# Patient Record
Sex: Male | Born: 1960
Health system: Southern US, Community
[De-identification: ages and names within clinical notes are randomized; demographics above are authoritative.]

## PROBLEM LIST (undated history)

## (undated) DIAGNOSIS — Z8619 Personal history of other infectious and parasitic diseases: Secondary | ICD-10-CM

## (undated) DIAGNOSIS — I10 Essential (primary) hypertension: Secondary | ICD-10-CM

## (undated) DIAGNOSIS — E785 Hyperlipidemia, unspecified: Secondary | ICD-10-CM

## (undated) HISTORY — DX: Personal history of other infectious and parasitic diseases: Z86.19

## (undated) HISTORY — DX: Essential (primary) hypertension: I10

## (undated) HISTORY — DX: Hyperlipidemia, unspecified: E78.5

## (undated) HISTORY — PX: HERNIA REPAIR: SHX51

---

## 1997-08-01 HISTORY — PX: HERNIA REPAIR: SHX51

## 2012-11-11 ENCOUNTER — Emergency Department (INDEPENDENT_AMBULATORY_CARE_PROVIDER_SITE_OTHER): Payer: Self-pay

## 2012-11-11 ENCOUNTER — Emergency Department (HOSPITAL_COMMUNITY)
Admission: EM | Admit: 2012-11-11 | Discharge: 2012-11-11 | Disposition: A | Discharge status: Home / Self Care | Payer: Self-pay | Source: Home / Self Care | Attending: Emergency Medicine | Admitting: Emergency Medicine

## 2012-11-11 ENCOUNTER — Encounter (HOSPITAL_COMMUNITY): Payer: Self-pay | Admitting: Emergency Medicine

## 2012-11-11 DIAGNOSIS — T148XXA Other injury of unspecified body region, initial encounter: Secondary | ICD-10-CM

## 2012-11-11 DIAGNOSIS — S62609A Fracture of unspecified phalanx of unspecified finger, initial encounter for closed fracture: Secondary | ICD-10-CM

## 2012-11-11 DIAGNOSIS — IMO0002 Reserved for concepts with insufficient information to code with codable children: Secondary | ICD-10-CM

## 2012-11-11 DIAGNOSIS — Z23 Encounter for immunization: Secondary | ICD-10-CM

## 2012-11-11 DIAGNOSIS — S61209A Unspecified open wound of unspecified finger without damage to nail, initial encounter: Secondary | ICD-10-CM

## 2012-11-11 MED ORDER — AMOXICILLIN-POT CLAVULANATE 500-125 MG PO TABS: 1.0000 | ORAL_TABLET | Freq: Three times a day (TID) | ORAL | Status: AC

## 2012-11-11 MED ORDER — TETANUS-DIPHTH-ACELL PERTUSSIS 5-2.5-18.5 LF-MCG/0.5 IM SUSP
INTRAMUSCULAR | Status: AC
  Filled 2012-11-11: qty 0.5

## 2012-11-11 MED ORDER — TETANUS-DIPHTH-ACELL PERTUSSIS 5-2.5-18.5 LF-MCG/0.5 IM SUSP
0.5000 mL | Freq: Once | INTRAMUSCULAR | Status: AC
  Administered 2012-11-11: 0.5 mL via INTRAMUSCULAR
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
## 2018-04-25 ENCOUNTER — Other Ambulatory Visit: Payer: Self-pay

## 2018-04-25 ENCOUNTER — Ambulatory Visit (HOSPITAL_COMMUNITY)
Admission: EM | Admit: 2018-04-25 | Discharge: 2018-04-25 | Disposition: A | Discharge status: Home / Self Care | Payer: Self-pay | Attending: Family Medicine | Admitting: Family Medicine

## 2018-04-25 ENCOUNTER — Encounter (HOSPITAL_COMMUNITY): Payer: Self-pay | Admitting: Emergency Medicine

## 2018-04-25 DIAGNOSIS — R21 Rash and other nonspecific skin eruption: Secondary | ICD-10-CM

## 2018-04-25 NOTE — Discharge Instructions (Addendum)
Keep monitoring rash for healing, possible bites or shingles Do not appear infected Continue to use itching cream if this relieves your symptoms  Your blood pressure was elevated today in clinic. Please be sure to take blood pressure medications as prescribed. Please monitor your blood pressure at home or when you go to a [facility]. Please follow up with your primary care doctor to recheck blood pressure and discuss any need for medication changes.   Please go to Emergency Room if you start to experience severe headache, vision changes, decreased urine production, chest pain, shortness of breath, speech slurring, one sided weakness.

## 2018-04-25 NOTE — ED Triage Notes (Signed)
Pt has a rash on his anterior & posterior torso and left flank that started one week ago.  Pt reports pain initially, but the rash has dried up and scabbed over and he is not having pain anymore.

## 2018-04-25 NOTE — ED Provider Notes (Signed)
[facility]    CSN: [id number] Arrival date & time: 04/25/18  2458     History   Chief Complaint Chief Complaint  Patient presents with  . Rash    HPI Arthur Jenkins is a 57 y.o. male no significant past medical history presenting today for evaluation of rash to his left side.  Patient states that this started approximately 5 to 6 days ago.  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  Started off as itching and painful, denies any symptoms prior to onset of rash.  Over the past week the rash has dried up and scabbed.  He denies any drainage.  He notes minimal pain.  He has been using hydrocortisone cream over-the-counter with relief of his symptoms.  Does note he had chickenpox when he was a child.  HPI  History reviewed. No pertinent past medical history.  There are no active problems to display for this patient.   Past Surgical History:  Procedure Laterality Date  . HERNIA REPAIR         Home Medications    Prior to Admission medications   Not on File    Family History History reviewed. No pertinent family history.  Social History Social History   Tobacco Use  . Smoking status: Never Smoker  Substance Use Topics  . Alcohol use: Yes  . Drug use: No     Allergies   Patient has no known allergies.   Review of Systems Review of Systems  Constitutional: Negative for fatigue and fever.  Eyes: Negative for redness, itching and visual disturbance.  Respiratory: Negative for shortness of breath.   Cardiovascular: Negative for chest pain and leg swelling.  Gastrointestinal: Negative for nausea and vomiting.  Musculoskeletal: Negative for arthralgias and myalgias.  Skin: Positive for color change and rash. Negative for wound.  Neurological: Negative for dizziness, syncope, weakness, light-headedness and headaches.     Physical Exam Triage Vital Signs ED  Triage Vitals  Enc Vitals Group     BP 04/25/18 0847 (!) 187/127     Pulse Rate 04/25/18 0847 62     Resp --      Temp 04/25/18 0847 98.4 F (36.9 C)     Temp Source 04/25/18 0847 Oral     SpO2 04/25/18 0847 100 %     Weight --      Height --      Head Circumference --      Peak Flow --      Pain Score 04/25/18 0846 0     Pain Loc --      Pain Edu? --      Excl. in [place]? --    No data found.  Updated Vital Signs BP (!) 174/84 (BP Location: Right Arm)   Pulse 62   Temp 98.4 F (36.9 C) (Oral)   SpO2 100%   Visual Acuity Right Eye Distance:   Left Eye Distance:   Bilateral Distance:    Right Eye Near:   Left Eye Near:    Bilateral Near:     Physical Exam  Constitutional: He is oriented to person, place, and time. He appears well-developed and well-nourished.  No acute distress  HENT:  Head: Normocephalic and atraumatic.  Nose: Nose normal.  Eyes: Conjunctivae are normal.  Neck: Neck supple.  Cardiovascular: Normal rate.  Pulmonary/Chest: Effort normal. No respiratory distress.  Breathing comfortably  at rest, CTABL, no wheezing, rales or other adventitious sounds auscultated  Abdominal: He exhibits no distension.  Musculoskeletal: Normal range of motion.  Neurological: He is alert and oriented to person, place, and time.  Skin: Skin is warm and dry.  Multiple clustered scabbed lesions to left abdomen extending to flank and back, does not cross midline anteriorly or posteriorly.  Minimal surrounding erythema, no increased warmth.  Mild tenderness to palpation overlying left ribs on side.  No lesions on upper extremities or lower extremities.  Psychiatric: He has a normal mood and affect.  Nursing note and vitals reviewed.    UC Treatments / Results  Labs (all labs ordered are listed, but only abnormal results are displayed) Labs Reviewed - No data to display  EKG None  Radiology No results found.  Procedures Procedures (including critical care  time)  Medications Ordered in UC Medications - No data to display  Initial Impression / Assessment and Plan / UC Course  I have reviewed the triage vital signs and the nursing notes.  Pertinent labs & imaging results that were available during my care of the patient were reviewed by me and considered in my medical decision making (see chart for details).    Rash concerning for possible shingles, given distribution, appears to be at the not on contagious/shedding stage as lesions are all scabbed over.  Patient with minimal discomfort.  At this point will defer antivirals given stage of healing and 6 days since start.  Discussed with patient that this could still possibly be bug bites, but it appeared to be healing well.  Do not appear infected.  Continue to itch cream, continue to monitor for continued healing.Discussed strict return precautions. Patient verbalized understanding and is agreeable with plan.  Discussed with patient about his blood pressure being elevated, rechecked and was 174/84.  Discussed with patient getting set up with [facility] for a primary care.  Monitor blood pressure.  Discussed signs and symptoms to watch out for with elevated blood pressure.Discussed strict return precautions. Patient verbalized understanding and is agreeable with plan.  Final Clinical Impressions(s) / UC Diagnoses   Final diagnoses:  Rash and nonspecific skin eruption     Discharge Instructions     Keep monitoring rash for healing, possible bites or shingles Do not appear infected Continue to use itching cream if this relieves your symptoms  Your blood pressure was elevated today in clinic. Please be sure to take blood pressure medications as prescribed. Please monitor your blood pressure at home or when you go to a [facility]. Please follow up with your primary care doctor to recheck blood pressure and discuss any need for medication changes.   Please go to Emergency  Room if you start to experience severe headache, vision changes, decreased urine production, chest pain, shortness of breath, speech slurring, one sided weakness.    ED Prescriptions    None     Controlled Substance Prescriptions [place] Controlled Substance Registry consulted? Not Applicable   Janith Lima, [place] 04/25/18 [phone]

## 2018-05-08 ENCOUNTER — Inpatient Hospital Stay: Payer: Self-pay | Admitting: Family Medicine

## 2018-05-08 ENCOUNTER — Ambulatory Visit: Payer: Self-pay | Attending: Family Medicine | Admitting: Family Medicine

## 2018-05-08 ENCOUNTER — Encounter: Payer: Self-pay | Admitting: Family Medicine

## 2018-05-08 VITALS — BP 155/101 | HR 67 | Temp 98.4°F | Resp 18 | Ht 71.5 in | Wt 198.0 lb

## 2018-05-08 DIAGNOSIS — R21 Rash and other nonspecific skin eruption: Secondary | ICD-10-CM | POA: Insufficient documentation

## 2018-05-08 DIAGNOSIS — I10 Essential (primary) hypertension: Secondary | ICD-10-CM

## 2018-05-08 DIAGNOSIS — Z79899 Other long term (current) drug therapy: Secondary | ICD-10-CM | POA: Insufficient documentation

## 2018-05-08 DIAGNOSIS — F1290 Cannabis use, unspecified, uncomplicated: Secondary | ICD-10-CM | POA: Insufficient documentation

## 2018-05-08 DIAGNOSIS — Z1211 Encounter for screening for malignant neoplasm of colon: Secondary | ICD-10-CM

## 2018-05-08 MED ORDER — LOSARTAN POTASSIUM 50 MG PO TABS: 50.0000 mg | ORAL_TABLET | Freq: Every day | ORAL | 3 refills | Status: DC

## 2018-05-08 NOTE — Patient Instructions (Signed)
DASH Eating Plan DASH stands for "Dietary Approaches to Stop Hypertension." The DASH eating plan is a healthy eating plan that has been shown to reduce high blood pressure (hypertension). It may also reduce your risk for type 2 diabetes, heart disease, and stroke. The DASH eating plan may also help with weight loss. What are tips for following this plan? General guidelines  Avoid eating more than 2,300 mg (milligrams) of salt (sodium) a day. If you have hypertension, you may need to reduce your sodium intake to 1,500 mg a day.  Limit alcohol intake to no more than 1 drink a day for nonpregnant women and 2 drinks a day for men. One drink equals 12 oz of beer, 5 oz of wine, or 1 oz of hard liquor.  Work with your health care provider to maintain a healthy body weight or to lose weight. Ask what an ideal weight is for you.  Get at least 30 minutes of exercise that causes your heart to beat faster (aerobic exercise) most days of the week. Activities may include walking, swimming, or biking.  Work with your health care provider or diet and nutrition specialist (dietitian) to adjust your eating plan to your individual calorie needs. Reading food labels  Check food labels for the amount of sodium per serving. Choose foods with less than 5 percent of the Daily Value of sodium. Generally, foods with less than 300 mg of sodium per serving fit into this eating plan.  To find whole grains, look for the word "whole" as the first word in the ingredient list. Shopping  Buy products labeled as "low-sodium" or "no salt added."  Buy fresh foods. Avoid canned foods and premade or frozen meals. Cooking  Avoid adding salt when cooking. Use salt-free seasonings or herbs instead of table salt or sea salt. Check with your health care provider or pharmacist before using salt substitutes.  Do not fry foods. Cook foods using healthy methods such as baking, boiling, grilling, and broiling instead.  Cook with  heart-healthy oils, such as olive, canola, soybean, or sunflower oil. Meal planning   Eat a balanced diet that includes: ? 5 or more servings of fruits and vegetables each day. At each meal, try to fill half of your plate with fruits and vegetables. ? Up to 6-8 servings of whole grains each day. ? Less than 6 oz of lean meat, poultry, or fish each day. A 3-oz serving of meat is about the same size as a deck of cards. One egg equals 1 oz. ? 2 servings of low-fat dairy each day. ? A serving of nuts, seeds, or beans 5 times each week. ? Heart-healthy fats. Healthy fats called Omega-3 fatty acids are found in foods such as flaxseeds and coldwater fish, like sardines, salmon, and mackerel.  Limit how much you eat of the following: ? Canned or prepackaged foods. ? Food that is high in trans fat, such as fried foods. ? Food that is high in saturated fat, such as fatty meat. ? Sweets, desserts, sugary drinks, and other foods with added sugar. ? Full-fat dairy products.  Do not salt foods before eating.  Try to eat at least 2 vegetarian meals each week.  Eat more home-cooked food and less restaurant, buffet, and fast food.  When eating at a restaurant, ask that your food be prepared with less salt or no salt, if possible. What foods are recommended? The items listed may not be a complete list. Talk with your dietitian about what   dietary choices are best for you. Grains Whole-grain or whole-wheat bread. Whole-grain or whole-wheat pasta. Brown rice. Oatmeal. Quinoa. Bulgur. Whole-grain and low-sodium cereals. Pita bread. Low-fat, low-sodium crackers. Whole-wheat flour tortillas. Vegetables Fresh or frozen vegetables (raw, steamed, roasted, or grilled). Low-sodium or reduced-sodium tomato and vegetable juice. Low-sodium or reduced-sodium tomato sauce and tomato paste. Low-sodium or reduced-sodium canned vegetables. Fruits All fresh, dried, or frozen fruit. Canned fruit in natural juice (without  added sugar). Meat and other protein foods Skinless chicken or turkey. Ground chicken or turkey. Pork with fat trimmed off. Fish and seafood. Egg whites. Dried beans, peas, or lentils. Unsalted nuts, nut butters, and seeds. Unsalted canned beans. Lean cuts of beef with fat trimmed off. Low-sodium, lean deli meat. Dairy Low-fat (1%) or fat-free (skim) milk. Fat-free, low-fat, or reduced-fat cheeses. Nonfat, low-sodium ricotta or cottage cheese. Low-fat or nonfat yogurt. Low-fat, low-sodium cheese. Fats and oils Soft margarine without trans fats. Vegetable oil. Low-fat, reduced-fat, or light mayonnaise and salad dressings (reduced-sodium). Canola, safflower, olive, soybean, and sunflower oils. Avocado. Seasoning and other foods Herbs. Spices. Seasoning mixes without salt. Unsalted popcorn and pretzels. Fat-free sweets. What foods are not recommended? The items listed may not be a complete list. Talk with your dietitian about what dietary choices are best for you. Grains Baked goods made with fat, such as croissants, muffins, or some breads. Dry pasta or rice meal packs. Vegetables Creamed or fried vegetables. Vegetables in a cheese sauce. Regular canned vegetables (not low-sodium or reduced-sodium). Regular canned tomato sauce and paste (not low-sodium or reduced-sodium). Regular tomato and vegetable juice (not low-sodium or reduced-sodium). Pickles. Olives. Fruits Canned fruit in a light or heavy syrup. Fried fruit. Fruit in cream or butter sauce. Meat and other protein foods Fatty cuts of meat. Ribs. Fried meat. Bacon. Sausage. Bologna and other processed lunch meats. Salami. Fatback. Hotdogs. Bratwurst. Salted nuts and seeds. Canned beans with added salt. Canned or smoked fish. Whole eggs or egg yolks. Chicken or turkey with skin. Dairy Whole or 2% milk, cream, and half-and-half. Whole or full-fat cream cheese. Whole-fat or sweetened yogurt. Full-fat cheese. Nondairy creamers. Whipped toppings.  Processed cheese and cheese spreads. Fats and oils Butter. Stick margarine. Lard. Shortening. Ghee. Bacon fat. Tropical oils, such as coconut, palm kernel, or palm oil. Seasoning and other foods Salted popcorn and pretzels. Onion salt, garlic salt, seasoned salt, table salt, and sea salt. Worcestershire sauce. Tartar sauce. Barbecue sauce. Teriyaki sauce. Soy sauce, including reduced-sodium. Steak sauce. Canned and packaged gravies. Fish sauce. Oyster sauce. Cocktail sauce. Horseradish that you find on the shelf. Ketchup. Mustard. Meat flavorings and tenderizers. Bouillon cubes. Hot sauce and Tabasco sauce. Premade or packaged marinades. Premade or packaged taco seasonings. Relishes. Regular salad dressings. Where to find more information:  National Heart, Lung, and Blood Institute: www.nhlbi.nih.gov  American Heart Association: www.heart.org Summary  The DASH eating plan is a healthy eating plan that has been shown to reduce high blood pressure (hypertension). It may also reduce your risk for type 2 diabetes, heart disease, and stroke.  With the DASH eating plan, you should limit salt (sodium) intake to 2,300 mg a day. If you have hypertension, you may need to reduce your sodium intake to 1,500 mg a day.  When on the DASH eating plan, aim to eat more fresh fruits and vegetables, whole grains, lean proteins, low-fat dairy, and heart-healthy fats.  Work with your health care provider or diet and nutrition specialist (dietitian) to adjust your eating plan to your individual   calorie needs. This information is not intended to replace advice given to you by your health care provider. Make sure you discuss any questions you have with your health care provider. Document Released: 07/07/2011 Document Revised: 07/11/2016 Document Reviewed: 07/11/2016 Elsevier Interactive Patient Education  2018 [employer].  Hypertension Hypertension is another name for high blood pressure. High blood pressure  forces your heart to work harder to pump blood. This can cause problems over time. There are two numbers in a blood pressure reading. There is a top number (systolic) over a bottom number (diastolic). It is best to have a blood pressure below 120/80. Healthy choices can help lower your blood pressure. You may need medicine to help lower your blood pressure if:  Your blood pressure cannot be lowered with healthy choices.  Your blood pressure is higher than 130/80.  Follow these instructions at home: Eating and drinking  If directed, follow the DASH eating plan. This diet includes: ? Filling half of your plate at each meal with fruits and vegetables. ? Filling one quarter of your plate at each meal with whole grains. Whole grains include whole wheat pasta, brown rice, and whole grain bread. ? Eating or drinking low-fat dairy products, such as skim milk or low-fat yogurt. ? Filling one quarter of your plate at each meal with low-fat (lean) proteins. Low-fat proteins include fish, skinless chicken, eggs, beans, and tofu. ? Avoiding fatty meat, cured and processed meat, or chicken with skin. ? Avoiding premade or processed food.  Eat less than 1,500 mg of salt (sodium) a day.  Limit alcohol use to no more than 1 drink a day for nonpregnant women and 2 drinks a day for men. One drink equals 12 oz of beer, 5 oz of wine, or 1 oz of hard liquor. Lifestyle  Work with your doctor to stay at a healthy weight or to lose weight. Ask your doctor what the best weight is for you.  Get at least 30 minutes of exercise that causes your heart to beat faster (aerobic exercise) most days of the week. This may include walking, swimming, or biking.  Get at least 30 minutes of exercise that strengthens your muscles (resistance exercise) at least 3 days a week. This may include lifting weights or pilates.  Do not use any products that contain nicotine or tobacco. This includes cigarettes and e-cigarettes. If you  need help quitting, ask your doctor.  Check your blood pressure at home as told by your doctor.  Keep all follow-up visits as told by your doctor. This is important. Medicines  Take over-the-counter and prescription medicines only as told by your doctor. Follow directions carefully.  Do not skip doses of blood pressure medicine. The medicine does not work as well if you skip doses. Skipping doses also puts you at risk for problems.  Ask your doctor about side effects or reactions to medicines that you should watch for. Contact a doctor if:  You think you are having a reaction to the medicine you are taking.  You have headaches that keep coming back (recurring).  You feel dizzy.  You have swelling in your ankles.  You have trouble with your vision. Get help right away if:  You get a very bad headache.  You start to feel confused.  You feel weak or numb.  You feel faint.  You get very bad pain in your: ? Chest. ? Belly (abdomen).  You throw up (vomit) more than once.  You have trouble breathing.   Summary  Hypertension is another name for high blood pressure.  Making healthy choices can help lower blood pressure. If your blood pressure cannot be controlled with healthy choices, you may need to take medicine. This information is not intended to replace advice given to you by your health care provider. Make sure you discuss any questions you have with your health care provider. Document Released: 01/04/2008 Document Revised: 06/15/2016 Document Reviewed: 06/15/2016 Elsevier Interactive Patient Education  2018 Else[employer] Inc.  

## 2018-05-08 NOTE — Progress Notes (Signed)
Subjective:    Patient ID: Arthur Jenkins, male    DOB: [birth year], 57 y.o.   MRN: [id number]  HPI 57 year old male new to the practice who was worked into the schedule as a follow-up for a recent ED visit.  Patient presented to the urgent care secondary to the complaint of a rash the patient's blood pressure was found to be elevated at 187/127 per urgent care records.  Repeat blood pressure was 174/84.  Patient's rash was diagnosed as possible shingles but since patient's rash was 16 days old at the time of presentation and was improving, no medication was prescribed by urgent care.  Patient reports at today's visit that his rash has resolved.  Patient is seen today for follow-up of elevated blood pressure, hypertension.  Patient reports that he is also been checking his blood pressures at home and they have remained elevated anywhere from 150 to the 170s for the top number and 90s to 100s for the bottom number.  Patient however has had no headaches or dizziness.  Patient denies any chest pain, no shortness of breath no peripheral edema.  Patient has never had a prior diagnosis of high blood pressure.      Patient reports family history is significant for his mother passing away at age 29 due to pancreatic cancer otherwise there has been no other family members with cancer, no hypertension, no diabetes, no heart disease and no strokes.  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  Patient does not drink alcohol and does not smoke cigarettes but states that he does smoke marijuana on the weekends.  Patient has a past surgical history of left inguinal hernia repair as a young adult.  Patient reports no other significant past medical history.   Review of Systems  Constitutional: Negative for chills, diaphoresis, fatigue and fever.  Respiratory: Negative for cough, chest tightness and shortness of breath.   Cardiovascular: Negative for chest pain, palpitations and leg swelling.    Gastrointestinal: Negative for abdominal pain, blood in stool and nausea.  Genitourinary: Negative for dysuria and frequency.  Musculoskeletal: Positive for back pain (Occasional but not currently bothering him). Negative for arthralgias, gait problem and joint swelling.  Neurological: Negative for dizziness, weakness, light-headedness, numbness and headaches.       Objective:   Physical Exam BP (!) 155/101 (BP Location: Left Arm, Patient Position: Sitting, Cuff Size: Normal)   Pulse 67   Temp 98.4 F (36.9 C) (Oral)   Resp 18   Ht 5' 11.5" (1.816 m)   Wt 198 lb (89.8 kg)   SpO2 100%   BMI 27.23 kg/m  Vital signs and nurse's notes reviewed General-well-nourished, well-developed older male in no acute distress Neck-supple, no lymphadenopathy, no thyromegaly, no carotid bruit Cardiovascular-regular rate and rhythm Lungs-clear to auscultation bilaterally Abdomen-soft, nontender Back-no CVA tenderness Extremities-no edema        Assessment & Plan:  1. Essential hypertension Notes were reviewed from patient's recent urgent care visit and patient had elevated blood pressure at that time and he reports that home blood pressures have also been elevated.  Patient additionally with blood pressure of 155/101 at today's visit.  Patient agrees to start medication to help with his blood pressure.  Prescription was sent to patient's pharmacy for losartan 50 mg once daily patient was given information on DASH diet and hypertension as part of his after visit summary.  Patient will continue home monitoring and will return to clinic in a few  weeks for recheck/follow-up of blood pressure but should call sooner if he does not feel that his blood pressures improved or if he has any other concerns.  Patient will have BMP to check electrolytes and renal function as well as lipid panel as he has hypertension.  Patient will also have urine microalbumin to look for any albuminuria suggestive of injury to the  kidney secondary to patient's hypertension. - losartan (COZAAR) 50 MG tablet; Take 1 tablet (50 mg total) by mouth daily. To lower blood pressure  Dispense: 30 tablet; Refill: 3 - Basic Metabolic Panel - Lipid panel - Microalbumin/Creatinine Ratio, Urine  2. Screening for colon cancer Patient is currently 48 and has not had a baseline colonoscopy.  Patient agrees to be referred for screening colonoscopy. - Ambulatory referral to Gastroenterology  *Patient was offered influenza immunization at today's visit which he declined  An After Visit Summary was printed and given to the patient. Allergies as of 05/08/2018   No Known Allergies     Medication List        Accurate as of 05/08/18  6:06 PM. Always use your most recent med list.          losartan 50 MG tablet Commonly known as:  COZAAR Take 1 tablet (50 mg total) by mouth daily. To lower blood pressure       Return in about 2 weeks (around 05/22/2018) for Hypertension .

## 2018-05-09 LAB — BASIC METABOLIC PANEL WITH GFR
BUN/Creatinine Ratio: 11 (ref 9–20)
BUN: 12 mg/dL (ref 6–24)
CO2: 22 mmol/L (ref 20–29)
Calcium: 10.1 mg/dL (ref 8.7–10.2)
Chloride: 101 mmol/L (ref 96–106)
Creatinine, Ser: 1.05 mg/dL (ref 0.76–1.27)
GFR calc Af Amer: 91 mL/min/1.73
GFR calc non Af Amer: 79 mL/min/1.73
Glucose: 89 mg/dL (ref 65–99)
Potassium: 4.6 mmol/L (ref 3.5–5.2)
Sodium: 139 mmol/L (ref 134–144)

## 2018-05-09 LAB — LIPID PANEL
Chol/HDL Ratio: 4 ratio (ref 0.0–5.0)
Cholesterol, Total: 241 mg/dL — ABNORMAL HIGH (ref 100–199)
HDL: 60 mg/dL
LDL Calculated: 166 mg/dL — ABNORMAL HIGH (ref 0–99)
Triglycerides: 73 mg/dL (ref 0–149)
VLDL Cholesterol Cal: 15 mg/dL (ref 5–40)

## 2018-05-09 LAB — MICROALBUMIN / CREATININE URINE RATIO
Creatinine, Urine: 108.4 mg/dL
Microalb/Creat Ratio: 11.8 mg/g{creat} (ref 0.0–30.0)
Microalbumin, Urine: 12.8 ug/mL

## 2018-05-11 ENCOUNTER — Telehealth: Payer: Self-pay

## 2018-05-11 NOTE — Telephone Encounter (Signed)
Patient was called, no answer, lvm to return call regarding lab results.

## 2018-05-11 NOTE — Telephone Encounter (Signed)
-----   Message from Antony Blackbird, MD sent at 05/11/2018  2:05 PM EDT ----- Please notify patient that his BMP is normal and normal urine creatinine/microalbumin ratio. LDL/bad cholesterol is elevated at 166. He should consider statin medication to lower his cholesterol versus a 6 month trial of a low fat diet and exercise and then start cholesterol medication if his LDL is not 100 or less after 6 months. If he would like to start cholesterol medication then I will need to know where to send the medication

## 2018-05-15 ENCOUNTER — Inpatient Hospital Stay: Payer: Self-pay | Admitting: Family Medicine

## 2018-05-30 ENCOUNTER — Telehealth: Payer: Self-pay | Admitting: Urology

## 2018-05-30 ENCOUNTER — Ambulatory Visit: Payer: Self-pay | Attending: Family Medicine | Admitting: Family Medicine

## 2018-05-30 ENCOUNTER — Encounter: Payer: Self-pay | Admitting: Family Medicine

## 2018-05-30 VITALS — BP 134/84 | HR 83 | Temp 98.4°F | Resp 16 | Ht 71.5 in | Wt 199.6 lb

## 2018-05-30 DIAGNOSIS — Z8619 Personal history of other infectious and parasitic diseases: Secondary | ICD-10-CM | POA: Insufficient documentation

## 2018-05-30 DIAGNOSIS — Z79899 Other long term (current) drug therapy: Secondary | ICD-10-CM | POA: Insufficient documentation

## 2018-05-30 DIAGNOSIS — Z23 Encounter for immunization: Secondary | ICD-10-CM | POA: Insufficient documentation

## 2018-05-30 DIAGNOSIS — E785 Hyperlipidemia, unspecified: Secondary | ICD-10-CM | POA: Insufficient documentation

## 2018-05-30 DIAGNOSIS — E7800 Pure hypercholesterolemia, unspecified: Secondary | ICD-10-CM

## 2018-05-30 DIAGNOSIS — I10 Essential (primary) hypertension: Secondary | ICD-10-CM | POA: Insufficient documentation

## 2018-05-30 DIAGNOSIS — M545 Low back pain: Secondary | ICD-10-CM | POA: Insufficient documentation

## 2018-05-30 MED ORDER — ZOSTER VAC RECOMB ADJUVANTED 50 MCG/0.5ML IM SUSR: 0.5000 mL | Freq: Once | INTRAMUSCULAR | 1 refills | Status: AC

## 2018-05-30 MED ORDER — LOSARTAN POTASSIUM 50 MG PO TABS: 50.0000 mg | ORAL_TABLET | Freq: Every day | ORAL | 5 refills | Status: DC

## 2018-05-30 MED FILL — LOSARTAN POTASSIUM 50 MG TA: 50 | 30 days supply | Qty: 30 | Fill #0

## 2018-05-30 NOTE — Progress Notes (Signed)
Patient is here for hypertension.

## 2018-05-30 NOTE — Telephone Encounter (Signed)
Patient wanted to know when they were suppose to start the high cholesterol medication. Please follow up with patient.

## 2018-05-30 NOTE — Progress Notes (Signed)
Subjective:    Patient ID: Arthur Jenkins, male    DOB: 06-21-1961, 57 y.o.   MRN: [id number]  HPI       57 yo male who presents in follow-up of hypertension.  Patient was started on losartan 50 mg at his most recent visit.  Patient also had blood work including a BMP, urine microalbumin and lipid panel at his last visit and patient did have hyperlipidemia with an LDL of 166.  Patient states that he is doing well on the losartan 50 mg.  Patient however wishes that he did not need to be on medication.  Patient states that since his last visit he has made dietary changes and has started to exercise on a regular basis.  Patient is not interested in being on medication to help lower his cholesterol at this time.  Patient would like to see if the changes he has made in his diet and his exercise program will result in lower cholesterol.  Patient denies any headaches or dizziness, no chest pain or palpitations no shortness of breath or cough.  Patient did recently have some onset of mid right low back pain after picking up something incorrectly.  Patient denies any radiation of the back pain.  Patient denies any dysuria or urinary frequency.  Patient also with a history of shingles within the past few months on his left abdomen.  Patient states that the area still feels uncomfortable and he is interested in obtaining a vaccination to try and help avoid future shingles outbreaks. Past Medical History:  Diagnosis Date  . Essential hypertension   . History of shingles   . Hyperlipidemia    Family History  Problem Relation Age of Onset  . Pancreatic cancer Mother   . Hypertension Neg Hx   . Diabetes Neg Hx   . CAD Neg Hx   . CVA Neg Hx    Past Surgical History:  Procedure Laterality Date  . HERNIA REPAIR     Social History   Tobacco Use  . Smoking status: Never Smoker  . Smokeless tobacco: Never Used  Substance Use Topics  . Alcohol use: Yes    Alcohol/week: 3.0 - 4.0 standard drinks   Types: 3 - 4 Cans of beer per week    Comment: a night after work and socially on weekends  . Drug use: Yes    Types: Marijuana  No Known Allergies [place]   Review of Systems  Constitutional: Negative for chills, diaphoresis, fatigue and fever.  Respiratory: Negative for cough and shortness of breath.   Cardiovascular: Negative for chest pain, palpitations and leg swelling.  Gastrointestinal: Negative for abdominal pain, constipation and nausea.  Genitourinary: Negative for dysuria and frequency.  Musculoskeletal: Positive for back pain. Negative for gait problem and joint swelling.       Patient with complaint of mild right low back pain after picking up something heavy improperly  Skin:       Patient with complaint of skin discomfort, continued hyperpigmented rash status post shingles  Neurological: Negative for dizziness, light-headedness, numbness and headaches.       Objective:   Physical Exam BP 134/84 (BP Location: Left Arm, Patient Position: Sitting, Cuff Size: Normal)   Pulse 83   Temp 98.4 F (36.9 C) (Oral)   Resp 16   Ht 5' 11.5" (1.816 m)   Wt 199 lb 9.6 oz (90.5 kg)   SpO2 97%   BMI 27.45 kg/m Vital signs and nurse's notes reviewed General- well-nourished,  well-developed adult male in no acute distress Neck-supple, no lymphadenopathy, no thyromegaly, no carotid bruit Lungs-clear to auscultation bilaterally Cardiovascular-regular rate and rhythm Abdomen-mild truncal obesity, soft and nontender Back-no CVA tenderness, patient does have some right sided low back pain to palpation Extremities-no edema. Skin-patient with hyperpigmented scarring on the left mid abdomen which goes from the midline to the left flank        Assessment & Plan:  1. Essential hypertension Patient's blood pressure is within normal at today's visit.  Patient is encouraged to continue daily losartan 50 mg.  Patient states that he would like to get off of this medication at some point as  he does not like taking medications.  I discussed with the patient that the reason his blood pressure is improved today is more than likely secondary to the medication.  Patient should continue to monitor his blood pressure and if his blood pressures are consistently below 120/80 he should contact the office so that the dose of his medication can be decreased to 25 mg and if needed, blood pressure medication can be discontinued if patient stays below 120/80 consistently but he would still need to monitor his blood pressure because he would be at risk for blood pressure increasing if he does not continue to follow dietary changes and exercise program.  Patient will return to clinic in 6 months but should call sooner if any problems or concerns regarding his blood pressure. - losartan (COZAAR) 50 MG tablet; Take 1 tablet (50 mg total) by mouth daily. To lower blood pressure  Dispense: 30 tablet; Refill: 5  2. Pure hypercholesterolemia Discussed with patient that his recent lipid panel showed an LDL of 166.  Discussed goal of 100 or less for patient's LDL.  Patient reports that he has made dietary changes and is exercising and does not wish to be on medicine for his cholesterol.  Discussed with patient that handout will be provided as part of his AVS concerning hyperlipidemia as well as a diet to help reduce his cholesterol.  Discussed whole-grain foods, lean meats, avoidance of fried/fatty foods.  Patient is encouraged to continue exercise with goal of weight loss.  Patient will have repeat lipid panel in approximately 6 months.  3. History of shingles Patient with history of shingles and now still has some skin irritation, possible postherpetic neuralgia as he does have some mild discomfort/itching at the prior site of his outbreak.  Patient would like to have shingles vaccination to help reduce his risk of recurrence.  Order placed for Shingrix and patient will apply for assistance program to see if he may  receive this vaccination for free.  Patient will be contacted by vaccination coordinator regarding the outcome of his application. - Zoster Vaccine Adjuvanted [facility]) injection; Inject 0.5 mLs into the muscle once for 1 dose.  Dispense: 0.5 mL; Refill: 1  *Influenza immunization was offered again at today's visit and declined by the patient  An After Visit Summary was printed and given to the patient.  Return in about 6 months (around 11/29/2018) for blood pressure and lipids.

## 2018-05-30 NOTE — Patient Instructions (Signed)
Cholesterol Cholesterol is a fat. Your body needs a small amount of cholesterol. Cholesterol (plaque) may build up in your blood vessels (arteries). That makes you more likely to have a heart attack or stroke. You cannot feel your cholesterol level. Having a blood test is the only way to find out if your level is high. Keep your test results. Work with your doctor to keep your cholesterol at a good level. What do the results mean?  Total cholesterol is how much cholesterol is in your blood.  LDL is bad cholesterol. This is the type that can build up. Try to have low LDL.  HDL is good cholesterol. It cleans your blood vessels and carries LDL away. Try to have high HDL.  Triglycerides are fat that the body can store or burn for energy. What are good levels of cholesterol?  Total cholesterol below 200.  LDL below 100 is good for people who have health risks. LDL below 70 is good for people who have very high risks.  HDL above 40 is good. It is best to have HDL of 60 or higher.  Triglycerides below 150. How can I lower my cholesterol? Diet Follow your diet program as told by your doctor.  Choose fish, white meat chicken, or [place] that is roasted or baked. Try not to eat red meat, fried foods, sausage, or lunch meats.  Eat lots of fresh fruits and vegetables.  Choose whole grains, beans, pasta, potatoes, and cereals.  Choose olive oil, corn oil, or canola oil. Only use small amounts.  Try not to eat butter, mayonnaise, shortening, or palm kernel oils.  Try not to eat foods with trans fats.  Choose low-fat or nonfat dairy foods. ? Drink skim or nonfat milk. ? Eat low-fat or nonfat yogurt and cheeses. ? Try not to drink whole milk or cream. ? Try not to eat ice cream, egg yolks, or full-fat cheeses.  Healthy desserts include angel food cake, ginger snaps, animal crackers, hard candy, popsicles, and low-fat or nonfat frozen yogurt. Try not to eat pastries, cakes, pies, and  cookies.  Exercise Follow your exercise program as told by your doctor.  Be more active. Try gardening, walking, and taking the stairs.  Ask your doctor about ways that you can be more active.  Medicine  Take over-the-counter and prescription medicines only as told by your doctor. This information is not intended to replace advice given to you by your health care provider. Make sure you discuss any questions you have with your health care provider. Document Released: 10/14/2008 Document Revised: 02/17/2016 Document Reviewed: 01/28/2016 Elsevier Interactive Patient Education  2018 [employer].  Fat and Cholesterol Restricted Diet Getting too much fat and cholesterol in your diet may cause health problems. Following this diet helps keep your fat and cholesterol at normal levels. This can keep you from getting sick. What types of fat should I choose?  Choose monosaturated and polyunsaturated fats. These are found in foods such as olive oil, canola oil, flaxseeds, walnuts, almonds, and seeds.  Eat more omega-3 fats. Good choices include salmon, mackerel, sardines, tuna, flaxseed oil, and ground flaxseeds.  Limit saturated fats. These are in animal products such as meats, butter, and cream. They can also be in plant products such as palm oil, palm kernel oil, and coconut oil.  Avoid foods with partially hydrogenated oils in them. These contain trans fats. Examples of foods that have trans fats are stick margarine, some tub margarines, cookies, crackers, and other baked goods.  What general guidelines do I need to follow?  Check food labels. Look for the words "trans fat" and "saturated fat."  When preparing a meal: ? Fill half of your plate with vegetables and green salads. ? Fill one fourth of your plate with whole grains. Look for the word "whole" as the first word in the ingredient list. ? Fill one fourth of your plate with lean protein foods.  Eat more foods that have fiber, like  apples, carrots, beans, peas, and barley.  Eat more home-cooked foods. Eat less at restaurants and buffets.  Limit or avoid alcohol.  Limit foods high in starch and sugar.  Limit fried foods.  Cook foods without frying them. Baking, boiling, grilling, and broiling are all great options.  Lose weight if you are overweight. Losing even a small amount of weight can help your overall health. It can also help prevent diseases such as diabetes and heart disease. What foods can I eat? Grains Whole grains, such as whole wheat or whole grain breads, crackers, cereals, and pasta. Unsweetened oatmeal, bulgur, barley, quinoa, or brown rice. Corn or whole wheat flour tortillas. Vegetables Fresh or frozen vegetables (raw, steamed, roasted, or grilled). Green salads. Fruits All fresh, canned (in natural juice), or frozen fruits. Meat and Other Protein Products Ground beef (85% or leaner), grass-fed beef, or beef trimmed of fat. Skinless chicken or [place]. Ground chicken or [place]. Pork trimmed of fat. All fish and seafood. Eggs. Dried beans, peas, or lentils. Unsalted nuts or seeds. Unsalted canned or dry beans. Dairy Low-fat dairy products, such as skim or 1% milk, 2% or reduced-fat cheeses, low-fat ricotta or cottage cheese, or plain low-fat yogurt. Fats and Oils Tub margarines without trans fats. Light or reduced-fat mayonnaise and salad dressings. Avocado. Olive, canola, sesame, or safflower oils. Natural peanut or almond butter (choose ones without added sugar and oil). The items listed above may not be a complete list of recommended foods or beverages. Contact your dietitian for more options. What foods are not recommended? Grains White bread. White pasta. White rice. Cornbread. Bagels, pastries, and croissants. Crackers that contain trans fat. Vegetables White potatoes. Corn. Creamed or fried vegetables. Vegetables in a cheese sauce. Fruits Dried fruits. Canned fruit in light or heavy syrup.  Fruit juice. Meat and Other Protein Products Fatty cuts of meat. Ribs, chicken wings, bacon, sausage, bologna, salami, chitterlings, fatback, hot dogs, bratwurst, and packaged luncheon meats. Liver and organ meats. Dairy Whole or 2% milk, cream, half-and-half, and cream cheese. Whole milk cheeses. Whole-fat or sweetened yogurt. Full-fat cheeses. Nondairy creamers and whipped toppings. Processed cheese, cheese spreads, or cheese curds. Sweets and Desserts Corn syrup, sugars, honey, and molasses. Candy. Jam and jelly. Syrup. Sweetened cereals. Cookies, pies, cakes, donuts, muffins, and ice cream. Fats and Oils Butter, stick margarine, lard, shortening, ghee, or bacon fat. Coconut, palm kernel, or palm oils. Beverages Alcohol. Sweetened drinks (such as sodas, lemonade, and fruit drinks or punches). The items listed above may not be a complete list of foods and beverages to avoid. Contact your dietitian for more information. This information is not intended to replace advice given to you by your health care provider. Make sure you discuss any questions you have with your health care provider. Document Released: 01/17/2012 Document Revised: 03/24/2016 Document Reviewed: 10/17/2013 Elsevier Interactive Patient Education  [employer].

## 2018-05-31 ENCOUNTER — Other Ambulatory Visit: Payer: Self-pay | Admitting: Family Medicine

## 2018-05-31 DIAGNOSIS — Z79899 Other long term (current) drug therapy: Secondary | ICD-10-CM

## 2018-05-31 DIAGNOSIS — E7800 Pure hypercholesterolemia, unspecified: Secondary | ICD-10-CM

## 2018-05-31 MED ORDER — ATORVASTATIN CALCIUM 10 MG PO TABS: 10.0000 mg | ORAL_TABLET | Freq: Every day | ORAL | 6 refills | Status: DC

## 2018-05-31 MED FILL — ATORVASTATIN 10 MG TABLET: 10 | 30 days supply | Qty: 30 | Fill #0

## 2018-05-31 NOTE — Telephone Encounter (Signed)
I will send in the medication but at his recent visit he said that he only wanted to try changes in his diet. Please let patient know that I would also like for him to return in about 6 weeks after starting the cholesterol medication just to have a lab visit to check his liver function tests

## 2018-05-31 NOTE — Telephone Encounter (Signed)
Patient called requesting for the high cholesterol medication. Patient stated that he wants to take the medication and have a life style change  Please have CMA call the patient once the medication has been sent to [facility].

## 2018-05-31 NOTE — Progress Notes (Signed)
Patient ID: Arthur Jenkins, male   DOB: 1961/05/06, 57 y.o.   MRN: [id number]   Phone call received from the patient today stating that he would like to start the use of cholesterol medication.  When patient was recently seen in clinic, patient stated that he wished to try dietary changes only and follow-up in 6 months for recheck of his lipids.  Since patient would now like to start medication for treatment of his hyperlipidemia, prescription will be sent to pharmacy for atorvastatin 10 mg once daily and patient will also be informed by the CMA to return to clinic in approximately 6 weeks after starting the atorvastatin and noted to have a lab visit for LFTs.

## 2018-06-11 ENCOUNTER — Encounter: Payer: Self-pay | Admitting: Urology

## 2018-07-06 MED FILL — LOSARTAN POTASSIUM 50 MG TA: 50 | 30 days supply | Qty: 30 | Fill #1

## 2018-08-10 MED FILL — ATORVASTATIN 10 MG TABLET: 10 | 30 days supply | Qty: 30 | Fill #1

## 2018-08-10 MED FILL — LOSARTAN POTASSIUM 50 MG TA: 50 | 30 days supply | Qty: 30 | Fill #2

## 2018-09-13 MED FILL — LOSARTAN POTASSIUM 50 MG TA: 50 | 30 days supply | Qty: 30 | Fill #3

## 2018-09-13 MED FILL — ATORVASTATIN 10 MG TABLET: 10 | 30 days supply | Qty: 30 | Fill #2

## 2018-10-15 MED FILL — LOSARTAN POTASSIUM 50 MG TA: 50 | 30 days supply | Qty: 30 | Fill #4

## 2018-11-20 MED FILL — LOSARTAN POTASSIUM 50 MG TA: 50 | 30 days supply | Qty: 30 | Fill #5

## 2018-11-26 ENCOUNTER — Ambulatory Visit: Payer: Self-pay | Attending: Family Medicine | Admitting: Family Medicine

## 2018-11-26 ENCOUNTER — Other Ambulatory Visit: Payer: Self-pay

## 2018-11-26 ENCOUNTER — Encounter: Payer: Self-pay | Admitting: Family Medicine

## 2018-11-26 DIAGNOSIS — I10 Essential (primary) hypertension: Secondary | ICD-10-CM

## 2018-11-26 DIAGNOSIS — E7800 Pure hypercholesterolemia, unspecified: Secondary | ICD-10-CM

## 2018-11-26 DIAGNOSIS — Z79899 Other long term (current) drug therapy: Secondary | ICD-10-CM

## 2018-11-26 MED ORDER — LOSARTAN POTASSIUM 50 MG PO TABS: 50.0000 mg | ORAL_TABLET | Freq: Every day | ORAL | 1 refills | Status: DC

## 2018-11-26 MED ORDER — ATORVASTATIN CALCIUM 10 MG PO TABS: 10.0000 mg | ORAL_TABLET | Freq: Every day | ORAL | 1 refills | Status: DC

## 2018-11-26 MED FILL — ATORVASTATIN 10 MG TABLET: 10 | 30 days supply | Qty: 30 | Fill #0

## 2018-11-26 NOTE — Progress Notes (Signed)
6 mo f/u for HTN and cholesterol

## 2018-11-26 NOTE — Progress Notes (Signed)
Virtual Visit via Telephone Note  I connected with Arthur Jenkins  on 11/26/18 at  8:30 AM EDT by telephone and verified that I am speaking with the correct person using two identifiers.   I discussed the limitations, risks, security and privacy concerns of performing an evaluation and management service by telephone and the availability of in person appointments. I also discussed with the patient that there may be a patient responsible charge related to this service. The patient expressed understanding and agreed to proceed.  Patient Location: Home Provider Location: Office Others participating in call: Emilio Aspen, CMA   History of Present Illness:      58 yo male seen in follow-up of Hypertension and Hyperlipidemia. He reports that he is taking his losartan 50 mg for control of his blood pressure.  He reports that he has been checking his home blood pressure.  Patient states that his blood pressure runs from the 140s to 150s over 80s.  He is not interested in increasing his dose of blood pressure medication.  Patient is also taking his cholesterol medication, Lipitor 10 mg.  He also states that he has made dietary changes.  He denies any increased muscle aches with the use of cholesterol medication.  He states that he is exercising daily and overall feels well  Past Medical History:  Diagnosis Date  . Essential hypertension   . History of shingles   . Hyperlipidemia     Past Surgical History:  Procedure Laterality Date  . HERNIA REPAIR      Family History  Problem Relation Age of Onset  . Pancreatic cancer Mother   . Hypertension Neg Hx   . Diabetes Neg Hx   . CAD Neg Hx   . CVA Neg Hx     Social History   Tobacco Use  . Smoking status: Never Smoker  . Smokeless tobacco: Never Used  Substance Use Topics  . Alcohol use: Yes    Alcohol/week: 3.0 - 4.0 standard drinks    Types: 3 - 4 Cans of beer per week    Comment: a night after work and socially on weekends  .  Drug use: Yes    Types: Marijuana     No Known Allergies   Review of Systems  Constitutional: Negative for chills, fever and malaise/fatigue.  HENT: Negative for congestion and sore throat.   Eyes: Negative for blurred vision and double vision.  Respiratory: Negative for cough and shortness of breath.   Cardiovascular: Negative for chest pain and palpitations.  Gastrointestinal: Negative for abdominal pain, constipation and diarrhea.  Genitourinary: Negative for dysuria and frequency.  Musculoskeletal: Negative for joint pain and myalgias.  Neurological: Negative for dizziness and headaches.  Endo/Heme/Allergies: Negative for polydipsia. Does not bruise/bleed easily.     Observations/Objective: No vital signs or physical exam conducted as visit was done via telephone  Assessment and Plan: 1. Essential hypertension Reports that he is checking his home blood pressure daily and systolic blood pressures are running between 140 and 150.  I suggested that his dose of Cozaar be increased to 100 mg daily but patient does not wish to increase his blood pressure medication dose.  He states that he will try to make dietary changes.  Patient is encouraged to schedule nurse visit for blood pressure check in approximately [redacted] weeks along with lab work and follow-up of hypertension, hyperlipidemia and long-term use of medicines.  DASH diet encouraged - losartan (COZAAR) 50 MG tablet; Take 1 tablet (50  mg total) by mouth daily. To lower blood pressure  Dispense: 90 tablet; Refill: 1 - Comprehensive metabolic panel; Future - Lipid panel; Future  2. Pure hypercholesterolemia He reports that he is taking Lipitor daily and he denies any lipid panel and complete metabolic panel in follow-up of use of cholesterol medication.  Low-fat diet and regular exercise encouraged - atorvastatin (LIPITOR) 10 MG tablet; Take 1 tablet (10 mg total) by mouth daily. In the evening to reduce cholesterol  Dispense: 90 tablet;  Refill: 1 - Comprehensive metabolic panel; Future - Lipid panel; Future  3. Encounter for long-term (current) use of medications Patient will return to clinic for CMP and lipid panel on follow-up for long-term use of medications - Comprehensive metabolic panel; Future - Lipid panel; Future  Follow Up Instructions:Return in about 6 months (around 05/28/2019) for HTN/Lipids- 4 week labs and BP check; .    I discussed the assessment and treatment plan with the patient. The patient was provided an opportunity to ask questions and all were answered. The patient agreed with the plan and demonstrated an understanding of the instructions.   The patient was advised to call back or seek an in-person evaluation if the symptoms worsen or if the condition fails to improve as anticipated.  I provided 8 minutes of non-face-to-face time during this encounter.   Antony Blackbird, MD

## 2018-12-26 MED FILL — ATORVASTATIN 10 MG TABLET: 10 | 30 days supply | Qty: 30 | Fill #1

## 2018-12-26 MED FILL — LOSARTAN POTASSIUM 50 MG TA: 50 | 30 days supply | Qty: 30 | Fill #0

## 2019-01-03 ENCOUNTER — Ambulatory Visit: Payer: Self-pay | Attending: Family Medicine

## 2019-01-03 ENCOUNTER — Other Ambulatory Visit: Payer: Self-pay

## 2019-01-03 DIAGNOSIS — I10 Essential (primary) hypertension: Secondary | ICD-10-CM

## 2019-01-03 DIAGNOSIS — E7800 Pure hypercholesterolemia, unspecified: Secondary | ICD-10-CM

## 2019-01-03 DIAGNOSIS — Z79899 Other long term (current) drug therapy: Secondary | ICD-10-CM

## 2019-01-04 ENCOUNTER — Telehealth: Payer: Self-pay | Admitting: *Deleted

## 2019-01-04 LAB — COMPREHENSIVE METABOLIC PANEL WITH GFR
ALT: 26 IU/L (ref 0–44)
AST: 32 IU/L (ref 0–40)
Albumin/Globulin Ratio: 2 (ref 1.2–2.2)
Albumin: 4.8 g/dL (ref 3.8–4.9)
Alkaline Phosphatase: 74 IU/L (ref 39–117)
BUN/Creatinine Ratio: 14 (ref 9–20)
BUN: 12 mg/dL (ref 6–24)
Bilirubin Total: <0.2 mg/dL (ref 0.0–1.2)
CO2: 23 mmol/L (ref 20–29)
Calcium: 9.9 mg/dL (ref 8.7–10.2)
Chloride: 102 mmol/L (ref 96–106)
Creatinine, Ser: 0.88 mg/dL (ref 0.76–1.27)
GFR calc Af Amer: 110 mL/min/1.73
GFR calc non Af Amer: 95 mL/min/1.73
Globulin, Total: 2.4 g/dL (ref 1.5–4.5)
Glucose: 92 mg/dL (ref 65–99)
Potassium: 4.4 mmol/L (ref 3.5–5.2)
Sodium: 138 mmol/L (ref 134–144)
Total Protein: 7.2 g/dL (ref 6.0–8.5)

## 2019-01-04 LAB — LIPID PANEL
Chol/HDL Ratio: 2.6 ratio (ref 0.0–5.0)
Cholesterol, Total: 158 mg/dL (ref 100–199)
HDL: 60 mg/dL
LDL Calculated: 89 mg/dL (ref 0–99)
Triglycerides: 44 mg/dL (ref 0–149)
VLDL Cholesterol Cal: 9 mg/dL (ref 5–40)

## 2019-01-04 NOTE — Telephone Encounter (Signed)
Patient verified DOB Patient is aware of cholesterol being wonderful and to continue with atorvastatin and healthy diet. No other concerns.

## 2019-01-04 NOTE — Telephone Encounter (Signed)
-----   Message from Antony Blackbird, MD sent at 01/04/2019  8:26 AM EDT ----- Please notify patient that his lipids are greatly improved. Bad cholesterol has decreased from 166 to 89. Continue atorvastatin and a healthy diet

## 2019-01-10 ENCOUNTER — Other Ambulatory Visit: Payer: Self-pay

## 2019-01-10 ENCOUNTER — Encounter: Payer: Self-pay | Admitting: Pharmacist

## 2019-01-10 ENCOUNTER — Ambulatory Visit: Payer: Self-pay | Attending: Family Medicine | Admitting: Pharmacist

## 2019-01-10 DIAGNOSIS — Z79899 Other long term (current) drug therapy: Secondary | ICD-10-CM

## 2019-01-10 NOTE — Progress Notes (Signed)
Patient presents for Shingrix. Counseling provided; no contraindications exist. Consent provided. Will coordinate with MAP/pharmacy for coverage.

## 2019-02-15 MED FILL — LOSARTAN POTASSIUM 50 MG TA: 50 | 30 days supply | Qty: 30 | Fill #1

## 2019-03-14 ENCOUNTER — Ambulatory Visit: Payer: Self-pay | Admitting: Pharmacist

## 2019-03-27 MED FILL — LOSARTAN POTASSIUM 50 MG TA: 50 | 30 days supply | Qty: 30 | Fill #2

## 2019-05-17 MED FILL — ATORVASTATIN 10 MG TABLET: 10 | 30 days supply | Qty: 30 | Fill #2

## 2019-05-17 MED FILL — LOSARTAN POTASSIUM 50 MG TA: 50 | 30 days supply | Qty: 30 | Fill #3

## 2019-07-05 MED FILL — ATORVASTATIN 10 MG TABLET: 10 | 30 days supply | Qty: 30 | Fill #3

## 2019-07-05 MED FILL — LOSARTAN POTASSIUM 50 MG TA: 50 | 30 days supply | Qty: 30 | Fill #4

## 2019-08-16 MED FILL — ATORVASTATIN 10 MG TABLET: 10 | 30 days supply | Qty: 30 | Fill #4

## 2019-08-16 MED FILL — LOSARTAN POTASSIUM 50 MG TA: 50 | 30 days supply | Qty: 30 | Fill #5

## 2019-09-20 ENCOUNTER — Other Ambulatory Visit: Payer: Self-pay | Admitting: Family Medicine

## 2019-09-20 DIAGNOSIS — I10 Essential (primary) hypertension: Secondary | ICD-10-CM

## 2019-09-20 MED FILL — ATORVASTATIN 10 MG TABLET: 10 | 30 days supply | Qty: 30 | Fill #5

## 2019-09-20 MED FILL — LOSARTAN POTASSIUM 50 MG TA: 50 | 30 days supply | Qty: 30 | Fill #0

## 2019-09-23 ENCOUNTER — Ambulatory Visit: Payer: Self-pay | Admitting: Family Medicine

## 2019-09-25 ENCOUNTER — Ambulatory Visit: Payer: Self-pay | Attending: Family Medicine | Admitting: Physician Assistant

## 2019-09-25 ENCOUNTER — Other Ambulatory Visit: Payer: Self-pay

## 2019-09-25 DIAGNOSIS — I10 Essential (primary) hypertension: Secondary | ICD-10-CM

## 2019-09-25 DIAGNOSIS — E7800 Pure hypercholesterolemia, unspecified: Secondary | ICD-10-CM

## 2019-09-25 MED ORDER — LOSARTAN POTASSIUM 50 MG PO TABS: 50.0000 mg | ORAL_TABLET | Freq: Every day | ORAL | 0 refills | Status: DC

## 2019-09-25 MED ORDER — ATORVASTATIN CALCIUM 10 MG PO TABS: 10.0000 mg | ORAL_TABLET | Freq: Every day | ORAL | 0 refills | Status: DC

## 2019-09-25 NOTE — Progress Notes (Signed)
DOB and name verified Needs med refills

## 2019-09-25 NOTE — Progress Notes (Signed)
Patient ID: Arthur Jenkins, male   DOB: [birth year], 59 y.o.   MRN: [id number] Virtual Visit via Telephone Note  I connected with Arthur Jenkins on 09/25/19 at  9:30 AM EST by telephone and verified that I am speaking with the correct person using two identifiers.   I discussed the limitations, risks, security and privacy concerns of performing an evaluation and management service by telephone and the availability of in person appointments. I also discussed with the patient that there may be a patient responsible charge related to this service. The patient expressed understanding and agreed to proceed.  PATIENT visit by telephone virtually in the context of Covid-19 pandemic. Patient location:  home My Location:  [place] office Persons on the call:  Me and the patient  History of Present Illness: Patient needs RF on meds.  Compliant with meds.  BP running 130/80.  No HA/CP/SOB/dizziness   Observations/Objective:  NAD.  A&Ox3   Assessment and Plan: Labs essentially normal 12/2018 1. Pure hypercholesterolemia - atorvastatin (LIPITOR) 10 MG tablet; Take 1 tablet (10 mg total) by mouth daily. In the evening to reduce cholesterol  Dispense: 90 tablet; Refill: 0  2. Essential hypertension Controlled- - losartan (COZAAR) 50 MG tablet; Take 1 tablet (50 mg total) by mouth daily.  Dispense: 90 tablet; Refill: 0  Follow Up Instructions: See PCP in 3 months   I discussed the assessment and treatment plan with the patient. The patient was provided an opportunity to ask questions and all were answered. The patient agreed with the plan and demonstrated an understanding of the instructions.   The patient was advised to call back or seek an in-person evaluation if the symptoms worsen or if the condition fails to improve as anticipated.  I provided 6 minutes of non-face-to-face time during this encounter.   Freeman Caldron, PA-C

## 2019-10-25 MED FILL — LOSARTAN POTASSIUM 50 MG TA: 50 | 30 days supply | Qty: 30 | Fill #0

## 2019-11-07 ENCOUNTER — Ambulatory Visit: Payer: Self-pay | Attending: Internal Medicine

## 2019-11-07 DIAGNOSIS — Z23 Encounter for immunization: Secondary | ICD-10-CM

## 2019-11-07 NOTE — Progress Notes (Signed)
   Covid-19 Vaccination Clinic  Name:  Arthur Jenkins    MRN: [id number] DOB: 05/11/1961  11/07/2019  Mr. Arthur Jenkins was observed post Covid-19 immunization for 15 minutes without incident. He was provided with Vaccine Information Sheet and instruction to access the V-Safe system.   Mr. Arthur Jenkins was instructed to call 911 with any severe reactions post vaccine: Marland Kitchen Difficulty breathing  . Swelling of face and throat  . A fast heartbeat  . A bad rash all over body  . Dizziness and weakness   Immunizations Administered    Name Date Dose VIS Date Route   Pfizer COVID-19 Vaccine 11/07/2019  9:29 AM 0.3 mL 07/12/2019 Intramuscular   Manufacturer: [place]   Lot: [id number]   [place]: [id number]

## 2019-11-26 ENCOUNTER — Other Ambulatory Visit: Payer: Self-pay | Admitting: Pharmacist

## 2019-11-26 DIAGNOSIS — I10 Essential (primary) hypertension: Secondary | ICD-10-CM

## 2019-11-26 MED ORDER — LOSARTAN POTASSIUM 50 MG PO TABS: 50.0000 mg | ORAL_TABLET | Freq: Every day | ORAL | 0 refills | Status: DC

## 2019-11-26 MED FILL — LOSARTAN POTASSIUM 50 MG TA: 50 | 14 days supply | Qty: 14 | Fill #0

## 2019-11-28 ENCOUNTER — Ambulatory Visit: Payer: Self-pay | Admitting: Family Medicine

## 2019-12-02 ENCOUNTER — Ambulatory Visit: Payer: Self-pay

## 2019-12-03 ENCOUNTER — Ambulatory Visit: Payer: Self-pay

## 2019-12-04 ENCOUNTER — Ambulatory Visit: Payer: Self-pay | Attending: Internal Medicine

## 2019-12-04 DIAGNOSIS — Z23 Encounter for immunization: Secondary | ICD-10-CM

## 2019-12-04 NOTE — Progress Notes (Signed)
   Covid-19 Vaccination Clinic  Name:  Arthur Jenkins    MRN: [id number] DOB: October 07, 1960  12/04/2019  [name] was observed post Covid-19 immunization for 15 minutes without incident. He was provided with Vaccine Information Sheet and instruction to access the V-Safe system.   [name] was instructed to call 911 with any severe reactions post vaccine: Marland Kitchen Difficulty breathing  . Swelling of face and throat  . A fast heartbeat  . A bad rash all over body  . Dizziness and weakness   Immunizations Administered    Name Date Dose VIS Date Route   Pfizer COVID-19 Vaccine 12/04/2019  9:31 AM 0.3 mL 09/25/2018 Intramuscular   Manufacturer: [place]   Lot: [id number]   [place]: [id number]

## 2019-12-05 ENCOUNTER — Encounter: Payer: Self-pay | Admitting: Family Medicine

## 2019-12-05 ENCOUNTER — Ambulatory Visit: Payer: Self-pay | Admitting: Family Medicine

## 2019-12-05 ENCOUNTER — Other Ambulatory Visit: Payer: Self-pay

## 2019-12-05 ENCOUNTER — Ambulatory Visit: Payer: Self-pay | Attending: Family Medicine | Admitting: Family Medicine

## 2019-12-05 VITALS — BP 154/70 | HR 78 | Temp 98.2°F | Ht 71.5 in | Wt 198.0 lb

## 2019-12-05 DIAGNOSIS — Z114 Encounter for screening for human immunodeficiency virus [HIV]: Secondary | ICD-10-CM

## 2019-12-05 DIAGNOSIS — Z79899 Other long term (current) drug therapy: Secondary | ICD-10-CM

## 2019-12-05 DIAGNOSIS — Z1159 Encounter for screening for other viral diseases: Secondary | ICD-10-CM

## 2019-12-05 DIAGNOSIS — E785 Hyperlipidemia, unspecified: Secondary | ICD-10-CM

## 2019-12-05 DIAGNOSIS — Z125 Encounter for screening for malignant neoplasm of prostate: Secondary | ICD-10-CM

## 2019-12-05 DIAGNOSIS — I10 Essential (primary) hypertension: Secondary | ICD-10-CM

## 2019-12-05 DIAGNOSIS — Z0000 Encounter for general adult medical examination without abnormal findings: Secondary | ICD-10-CM

## 2019-12-05 MED ORDER — ATORVASTATIN CALCIUM 10 MG PO TABS: 10.0000 mg | ORAL_TABLET | Freq: Every day | ORAL | 0 refills | Status: DC

## 2019-12-05 MED ORDER — LOSARTAN POTASSIUM 50 MG PO TABS: 50.0000 mg | ORAL_TABLET | Freq: Every day | ORAL | 0 refills | Status: DC

## 2019-12-05 MED FILL — ATORVASTATIN 10 MG TABLET: 10 | 30 days supply | Qty: 30 | Fill #0

## 2019-12-05 NOTE — Patient Instructions (Signed)
Cancer Screening for Men A cancer screening is a test or exam that checks for cancer. Your health care provider will recommend specific cancer screenings based on your age, personal history, and family history of cancer. Work with your health care provider to create a cancer screening schedule that protects your health. Why is cancer screening done? Cancer screening is done to look for cancer in the very early stages, before it spreads and becomes harder to treat and before you would start to notice symptoms. Finding cancer early improves the chances of successful treatment. It may save your life. Who should be screened for cancer? All men should be screened for colorectal cancer and skin cancer. Your health care provider may recommend screenings for other types of cancer if:  You had cancer before.  You have a family member with cancer.  You have abnormal genes that could increase the risk of cancer.  You have risk factors for certain cancers, such as smoking. When you should be screened for cancer depends on:  Your age.  Your medical history and your family's medical history.  Certain lifestyle factors, such as smoking.  Environmental exposure, such as to asbestos. What are some common cancer screenings? Lung cancer Lung cancer screening is done with a CT scan that looks for abnormal cells in the lungs. Discuss lung cancer screening with your health care provider if you are 57-46 years old and if any of the following apply to you:  You currently smoke.  You used to smoke heavily.  You have a smoking history of 1 pack a day for 30 years or 2 packs a day for 15 years.  You have quit smoking within the past 15 years. If you smoke heavily or if you used to smoke, you may need to be screened every year. Prostate cancer Prostate cancer screening is done with blood tests and an exam in which a health care provider uses a gloved finger to check prostate size (digital rectal exam). You  may need to be screened for prostate cancer if:  You have risk factors of prostate cancer, such as being African American or having a close family member with prostate cancer.  You have inherited gene changes or a genetic condition, including BRCA1 or BRCA2 gene mutations or Lynch syndrome.  You have symptoms of prostate cancer, such as problems urinating or erectile dysfunction. Prostate cancer screening for men with average risk may start at age 75. Men with risk factors may need to be screened earlier at age 97-45. Once you have been screened for prostate cancer, future screening may be recommended based on the results of your blood tests. Colorectal cancer  All adults should have screening for colorectal cancer starting at age 16 and continuing until age 68. Your health care provider may recommend screening at age 59. You will have tests every 1-10 years, depending on your results and the type of screening test. If you have a family history of colon or rectal cancer or other risk factors, you may need to start having screenings earlier. Talk with your health care provider about which screening test is right for you and how often you should be screened. Colorectal cancer screening looks for cancer or for growths called polyps that often form before cancer starts. Tests to look for cancer or polyps include:  Colonoscopy or flexible sigmoidoscopy. For these procedures, a flexible tube with a small camera is inserted into the rectum.  CT colonography. This test uses X-rays and a contrast dye  to check the colon for polyps. If a polyp is found, you may need to have a colonoscopy so the polyp can be located and removed. Tests to look for cancer in the stool (feces) include:  Guaiac-based fecal occult blood test (FOBT). This test detects blood in stool. It can be done at home with a kit.  Fecal immunochemical test (FIT). This test detects blood in stool. For this test, you will need to collect stool  samples at home.  Stool DNA test. This test looks for blood in stool and any changes in DNA that can lead to colon cancer. For this test, you will need to collect a stool sample at home and send it to a lab.  Skin cancer Skin cancer screening is done by checking the skin for unusual moles or spots and any changes in existing moles. Your health care provider should check your skin for signs of skin cancer at every physical exam. You should check your skin every month and tell your health care provider right away if anything looks unusual. Men with a higher-than-normal risk for skin cancer may want to see a skin specialist (dermatologist) for an annual body check. Where to find more information  [place]: SkinPromotion.no  Centers for Disease Control and Prevention: http://knight-sullivan.biz/  American Cancer Society: https://www.cancer.org/latest-news/4-cancer-screening-tests-for-men.html Contact a health care provider if:  You have concerns about any signs or symptoms of cancer, such as: ? Moles that have an unusual shape or color. ? Changes in existing moles. ? A sore on your skin that does not heal. ? Blood in your urine or stool. ? Fatigue that does not go away. ? Frequent pain or cramping in your abdomen. ? Coughing or trouble breathing that does not go away. ? Coughing up blood. ? Losing weight without trying. ? Changes in urination habits. ? Painful urination or ejaculation. Summary  Be aware of and watch for signs and symptoms of cancer, especially symptoms of lung cancer, prostate cancer, colorectal cancer, and skin cancer.  Early detection of cancer with cancer screening may save your life.  Talk with your health care provider about your specific cancer risks.  Work together with your health care provider to create a cancer screening plan that is right for you. This information is not intended to  replace advice given to you by your health care provider. Make sure you discuss any questions you have with your health care provider. Document Revised: 04/06/2018 Document Reviewed: 04/14/2016 Elsevier Patient Education  [place] Maintenance, Male Adopting a healthy lifestyle and getting preventive care are important in promoting health and wellness. Ask your health care provider about:  The right schedule for you to have regular tests and exams.  Things you can do on your own to prevent diseases and keep yourself healthy. What should I know about diet, weight, and exercise? Eat a healthy diet   Eat a diet that includes plenty of vegetables, fruits, low-fat dairy products, and lean protein.  Do not eat a lot of foods that are high in solid fats, added sugars, or sodium. Maintain a healthy weight Body mass index (BMI) is a measurement that can be used to identify possible weight problems. It estimates body fat based on height and weight. Your health care provider can help determine your BMI and help you achieve or maintain a healthy weight. Get regular exercise Get regular exercise. This is one of the most important things you can do for your health. Most adults  should:  Exercise for at least 150 minutes each week. The exercise should increase your heart rate and make you sweat (moderate-intensity exercise).  Do strengthening exercises at least twice a week. This is in addition to the moderate-intensity exercise.  Spend less time sitting. Even light physical activity can be beneficial. Watch cholesterol and blood lipids Have your blood tested for lipids and cholesterol at 58 years of age, then have this test every 5 years. You may need to have your cholesterol levels checked more often if:  Your lipid or cholesterol levels are high.  You are older than 58 years of age.  You are at high risk for heart disease. What should I know about cancer screening? Many  types of cancers can be detected early and may often be prevented. Depending on your health history and family history, you may need to have cancer screening at various ages. This may include screening for:  Colorectal cancer.  Prostate cancer.  Skin cancer.  Lung cancer. What should I know about heart disease, diabetes, and high blood pressure? Blood pressure and heart disease  High blood pressure causes heart disease and increases the risk of stroke. This is more likely to develop in people who have high blood pressure readings, are of African descent, or are overweight.  Talk with your health care provider about your target blood pressure readings.  Have your blood pressure checked: ? Every 3-5 years if you are 26-45 years of age. ? Every year if you are 76 years old or older.  If you are between the ages of 67 and 16 and are a current or former smoker, ask your health care provider if you should have a one-time screening for abdominal aortic aneurysm (AAA). Diabetes Have regular diabetes screenings. This checks your fasting blood sugar level. Have the screening done:  Once every three years after age 63 if you are at a normal weight and have a low risk for diabetes.  More often and at a younger age if you are overweight or have a high risk for diabetes. What should I know about preventing infection? Hepatitis B If you have a higher risk for hepatitis B, you should be screened for this virus. Talk with your health care provider to find out if you are at risk for hepatitis B infection. Hepatitis C Blood testing is recommended for:  Everyone born from [birth year] through 1964.  Anyone with known risk factors for hepatitis C. Sexually transmitted infections (STIs)  You should be screened each year for STIs, including gonorrhea and chlamydia, if: ? You are sexually active and are younger than 58 years of age. ? You are older than 58 years of age and your health care provider tells you  that you are at risk for this type of infection. ? Your sexual activity has changed since you were last screened, and you are at increased risk for chlamydia or gonorrhea. Ask your health care provider if you are at risk.  Ask your health care provider about whether you are at high risk for HIV. Your health care provider may recommend a prescription medicine to help prevent HIV infection. If you choose to take medicine to prevent HIV, you should first get tested for HIV. You should then be tested every 3 months for as long as you are taking the medicine. Follow these instructions at home: Lifestyle  Do not use any products that contain nicotine or tobacco, such as cigarettes, e-cigarettes, and chewing tobacco. If you need help  quitting, ask your health care provider.  Do not use street drugs.  Do not share needles.  Ask your health care provider for help if you need support or information about quitting drugs. Alcohol use  Do not drink alcohol if your health care provider tells you not to drink.  If you drink alcohol: ? Limit how much you have to 0-2 drinks a day. ? Be aware of how much alcohol is in your drink. In the [place] one drink equals one 12 oz bottle of beer (355 mL), one 5 oz glass of wine (148 mL), or one 1 oz glass of hard liquor (44 mL). General instructions  Schedule regular health, dental, and eye exams.  Stay current with your vaccines.  Tell your health care provider if: ? You often feel depressed. ? You have ever been abused or do not feel safe at home. Summary  Adopting a healthy lifestyle and getting preventive care are important in promoting health and wellness.  Follow your health care provider's instructions about healthy diet, exercising, and getting tested or screened for diseases.  Follow your health care provider's instructions on monitoring your cholesterol and blood pressure. This information is not intended to replace advice given to you by your  health care provider. Make sure you discuss any questions you have with your health care provider. Document Revised: 07/11/2018 Document Reviewed: 07/11/2018 Elsevier Patient Education  [place].  Hypertension, Adult Hypertension is another name for high blood pressure. High blood pressure forces your heart to work harder to pump blood. This can cause problems over time. There are two numbers in a blood pressure reading. There is a top number (systolic) over a bottom number (diastolic). It is best to have a blood pressure that is below 120/80. Healthy choices can help lower your blood pressure, or you may need medicine to help lower it. What are the causes? The cause of this condition is not known. Some conditions may be related to high blood pressure. What increases the risk?  Smoking.  Having type 2 diabetes mellitus, high cholesterol, or both.  Not getting enough exercise or physical activity.  Being overweight.  Having too much fat, sugar, calories, or salt (sodium) in your diet.  Drinking too much alcohol.  Having long-term (chronic) kidney disease.  Having a family history of high blood pressure.  Age. Risk increases with age.  Race. You may be at higher risk if you are African American.  Gender. Men are at higher risk than women before age 22. After age 65, women are at higher risk than men.  Having obstructive sleep apnea.  Stress. What are the signs or symptoms?  High blood pressure may not cause symptoms. Very high blood pressure (hypertensive crisis) may cause: ? Headache. ? Feelings of worry or nervousness (anxiety). ? Shortness of breath. ? Nosebleed. ? A feeling of being sick to your stomach (nausea). ? Throwing up (vomiting). ? Changes in how you see. ? Very bad chest pain. ? Seizures. How is this treated?  This condition is treated by making healthy lifestyle changes, such as: ? Eating healthy foods. ? Exercising more. ? Drinking less  alcohol.  Your health care provider may prescribe medicine if lifestyle changes are not enough to get your blood pressure under control, and if: ? Your top number is above 130. ? Your bottom number is above 80.  Your personal target blood pressure may vary. Follow these instructions at home: Eating and drinking   If told, follow  the DASH eating plan. To follow this plan: ? Fill one half of your plate at each meal with fruits and vegetables. ? Fill one fourth of your plate at each meal with whole grains. Whole grains include whole-wheat pasta, brown rice, and whole-grain bread. ? Eat or drink low-fat dairy products, such as skim milk or low-fat yogurt. ? Fill one fourth of your plate at each meal with low-fat (lean) proteins. Low-fat proteins include fish, chicken without skin, eggs, beans, and tofu. ? Avoid fatty meat, cured and processed meat, or chicken with skin. ? Avoid pre-made or processed food.  Eat less than 1,500 mg of salt each day.  Do not drink alcohol if: ? Your doctor tells you not to drink. ? You are pregnant, may be pregnant, or are planning to become pregnant.  If you drink alcohol: ? Limit how much you use to:  0-1 drink a day for women.  0-2 drinks a day for men. ? Be aware of how much alcohol is in your drink. In the [place] one drink equals one 12 oz bottle of beer (355 mL), one 5 oz glass of wine (148 mL), or one 1 oz glass of hard liquor (44 mL). Lifestyle   Work with your doctor to stay at a healthy weight or to lose weight. Ask your doctor what the best weight is for you.  Get at least 30 minutes of exercise most days of the week. This may include walking, swimming, or biking.  Get at least 30 minutes of exercise that strengthens your muscles (resistance exercise) at least 3 days a week. This may include lifting weights or doing Pilates.  Do not use any products that contain nicotine or tobacco, such as cigarettes, e-cigarettes, and chewing tobacco. If  you need help quitting, ask your doctor.  Check your blood pressure at home as told by your doctor.  Keep all follow-up visits as told by your doctor. This is important. Medicines  Take over-the-counter and prescription medicines only as told by your doctor. Follow directions carefully.  Do not skip doses of blood pressure medicine. The medicine does not work as well if you skip doses. Skipping doses also puts you at risk for problems.  Ask your doctor about side effects or reactions to medicines that you should watch for. Contact a doctor if you:  Think you are having a reaction to the medicine you are taking.  Have headaches that keep coming back (recurring).  Feel dizzy.  Have swelling in your ankles.  Have trouble with your vision. Get help right away if you:  Get a very bad headache.  Start to feel mixed up (confused).  Feel weak or numb.  Feel faint.  Have very bad pain in your: ? Chest. ? Belly (abdomen).  Throw up more than once.  Have trouble breathing. Summary  Hypertension is another name for high blood pressure.  High blood pressure forces your heart to work harder to pump blood.  For most people, a normal blood pressure is less than 120/80.  Making healthy choices can help lower blood pressure. If your blood pressure does not get lower with healthy choices, you may need to take medicine. This information is not intended to replace advice given to you by your health care provider. Make sure you discuss any questions you have with your health care provider. Document Revised: 03/28/2018 Document Reviewed: 03/28/2018 Elsevier Patient Education  2020 [employer].

## 2019-12-05 NOTE — Progress Notes (Signed)
Established Patient Office Visit  Subjective:  Patient ID: Arthur Jenkins, male    DOB: 03-22-1961  Age: 59 y.o. MRN: [id number]  CC: Annual physical exam; chronic issues  HPI Arthur Jenkins, 59 year old African-American male who presents secondary to complaint of needing an annual physical exam and patient also needs follow-up of chronic issues including hypertension and hyperlipidemia.  He is nonfasting and will return for fasting blood work.  He needs refills of his blood pressure medication, losartan and refill of atorvastatin.  He has had no issues with either medication.  He reports his blood pressure is increased today as he needs to pick up his refill of blood pressure medication.  He denies any issues with headaches or dizziness related to his blood pressure.  He denies any increased muscle or joint aches with the use of atorvastatin.        He reports that overall he feels well.  Family history is significant for mother with pancreatic cancer but negative for heart disease, diabetes and hypertension.  Patient has never smoked cigarettes.  His only surgery in the past has been hernia repair.  He also has a history of shingles.  He is willing to have screening test for hepatitis C and HIV.  He reports no significant issues with nocturia but is willing to have screening PSA.  He reports that he has had a past colonoscopy and does not believe that it is time for his repeat colonoscopy.  He is willing to sign a medical record release so that the records regarding his colonoscopy can be obtained. He was seen by a doctor with an office on [facility].  He does not recall any mention of polyps being removed or any other abnormalities on his colonoscopy.  He denies any current issues with abdominal pain, no change in bowel habits, no blood in the stool and no black stools.  Past Medical History:  Diagnosis Date  . Essential hypertension   . History of shingles   . Hyperlipidemia      Past Surgical History:  Procedure Laterality Date  . HERNIA REPAIR      Family History  Problem Relation Age of Onset  . Pancreatic cancer Mother   . Hypertension Neg Hx   . Diabetes Neg Hx   . CAD Neg Hx   . CVA Neg Hx     Social History   Socioeconomic History  . Marital status: Single    Spouse name: Not on file  . Number of children: Not on file  . Years of education: Not on file  . Highest education level: Not on file  Occupational History  . Not on file  Tobacco Use  . Smoking status: Never Smoker  . Smokeless tobacco: Never Used  Substance and Sexual Activity  . Alcohol use: Yes    Alcohol/week: 3.0 - 4.0 standard drinks    Types: 3 - 4 Cans of beer per week    Comment: a night after work and socially on weekends  . Drug use: Yes    Types: Marijuana  . Sexual activity: Yes  Other Topics Concern  . Not on file  Social History Narrative  . Not on file   Social Determinants of Health   Financial Resource Strain:   . Difficulty of Paying Living Expenses:   Food Insecurity:   . Worried About Charity fundraiser in the Last Year:   . Arboriculturist in the Last Year:   Transportation Needs:   .  Lack of Transportation (Medical):   Marland Kitchen Lack of Transportation (Non-Medical):   Physical Activity:   . Days of Exercise per Week:   . Minutes of Exercise per Session:   Stress:   . Feeling of Stress :   Social Connections:   . Frequency of Communication with Friends and Family:   . Frequency of Social Gatherings with Friends and Family:   . Attends Religious Services:   . Active Member of Clubs or Organizations:   . Attends Archivist Meetings:   Marland Kitchen Marital Status:   Intimate Partner Violence:   . Fear of Current or Ex-Partner:   . Emotionally Abused:   Marland Kitchen Physically Abused:   . Sexually Abused:     Outpatient Medications Prior to Visit  Medication Sig Dispense Refill  . atorvastatin (LIPITOR) 10 MG tablet Take 1 tablet (10 mg total) by mouth  daily. In the evening to reduce cholesterol 90 tablet 0  . losartan (COZAAR) 50 MG tablet Take 1 tablet (50 mg total) by mouth daily. 14 tablet 0   No facility-administered medications prior to visit.    No Known Allergies  ROS Review of Systems  Constitutional: Negative for chills, fatigue, fever and unexpected weight change.  HENT: Negative for sore throat and trouble swallowing.   Eyes: Negative for photophobia and visual disturbance.  Respiratory: Negative for cough and shortness of breath.   Cardiovascular: Negative for chest pain, palpitations and leg swelling.  Gastrointestinal: Negative for abdominal pain, blood in stool, constipation, diarrhea and nausea.  Endocrine: Negative for cold intolerance, heat intolerance, polydipsia, polyphagia and polyuria.  Genitourinary: Negative for dysuria and frequency.  Musculoskeletal: Negative for arthralgias and back pain.  Skin: Negative for rash and wound.  Allergic/Immunologic: Negative for environmental allergies, food allergies and immunocompromised state.  Neurological: Negative for dizziness and headaches.  Hematological: Negative for adenopathy. Does not bruise/bleed easily.  Psychiatric/Behavioral: Negative for self-injury and suicidal ideas.      Objective:    Physical Exam  Constitutional: He is oriented to person, place, and time. He appears well-developed and well-nourished.  Neck: No JVD present. No thyromegaly present.  Cardiovascular: Normal rate and regular rhythm.  No carotid bruit  Pulmonary/Chest: Effort normal and breath sounds normal.  Abdominal: Soft. There is no abdominal tenderness. There is no rebound and no guarding.  Musculoskeletal:        General: No tenderness or edema.     Cervical back: Normal range of motion and neck supple.     Comments: No CVA tenderness  Lymphadenopathy:    He has no cervical adenopathy.  Neurological: He is alert and oriented to person, place, and time. No cranial nerve  deficit.  Skin: Skin is warm and dry.  Psychiatric: He has a normal mood and affect. His behavior is normal.  Nursing note and vitals reviewed.   BP (!) 154/70   Pulse 78   Temp 98.2 F (36.8 C) (Temporal)   Ht 5' 11.5" (1.816 m)   Wt 198 lb (89.8 kg)   SpO2 96%   BMI 27.23 kg/m  Wt Readings from Last 3 Encounters:  12/05/19 198 lb (89.8 kg)  05/30/18 199 lb 9.6 oz (90.5 kg)  05/08/18 198 lb (89.8 kg)     Health Maintenance Due  Topic Date Due  . Hepatitis C Screening  Never done  . HIV Screening  Never done  . COLONOSCOPY  Never done      No results found for: TSH No results found for:  WBC, HGB, HCT, MCV, PLT Lab Results  Component Value Date   NA 138 01/03/2019   K 4.4 01/03/2019   CO2 23 01/03/2019   GLUCOSE 92 01/03/2019   BUN 12 01/03/2019   CREATININE 0.88 01/03/2019   BILITOT <0.2 01/03/2019   ALKPHOS 74 01/03/2019   AST 32 01/03/2019   ALT 26 01/03/2019   PROT 7.2 01/03/2019   ALBUMIN 4.8 01/03/2019   CALCIUM 9.9 01/03/2019   Lab Results  Component Value Date   CHOL 158 01/03/2019   Lab Results  Component Value Date   HDL 60 01/03/2019   Lab Results  Component Value Date   LDLCALC 89 01/03/2019   Lab Results  Component Value Date   TRIG 44 01/03/2019   Lab Results  Component Value Date   CHOLHDL 2.6 01/03/2019   No results found for: HGBA1C    Assessment & Plan:  1. Annual physical exam Educational material on preventative measures/Health maintenance patient's age group, 74-64, provided at today's visit as part of after visit summary.  He reports that he has had a screening colonoscopy within the past 10 years which was normal.  Patient was asked to sign a medical record release so that information regarding his prior colonoscopy could be obtained.  Other health maintenance issues were discussed with the patient at today's visit and he agrees to have screening for HIV, hepatitis C and PSA as a screening test for prostate cancer.  2.  Essential hypertension Refill provided for losartan 50 mg once daily and patient is to schedule follow-up appointment with clinical pharmacist for recheck of blood pressure.  He will also return for lab appointment for electrolytes as part of comprehensive metabolic panel.  Lipid panel will also be done at upcoming fasting lab visit. - Lipid panel; Future - losartan (COZAAR) 50 MG tablet; Take 1 tablet (50 mg total) by mouth daily.  Dispense: 90 tablet; Refill: 0  3. Hyperlipidemia, unspecified hyperlipidemia type Refill provided of atorvastatin 10 mg for prior hyperlipidemia and patient is to schedule fasting lab appointment for repeat lipid panel along with comprehensive metabolic panel and follow-up of statin use and use of losartan for treatment of hypertension.  He will be notified if an increase is needed in his dose of medication based on his lab work. - Lipid panel; Future - Comprehensive metabolic panel; Future - atorvastatin (LIPITOR) 10 MG tablet; Take 1 tablet (10 mg total) by mouth daily. In the evening to reduce cholesterol  Dispense: 90 tablet; Refill: 0  4. Encounter for long-term current use of medication At his upcoming lab visit, patient will have comprehensive metabolic panel and follow-up of long-term use of medications for the treatment of hypertension and hyperlipidemia/statin therapy - Comprehensive metabolic panel; Future  5. Encounter for hepatitis C screening test for low risk patient Patient agrees to have screening test for hepatitis C at his upcoming lab visit - Hepatitis C Antibody; Future  6. Screening for HIV (human immunodeficiency virus) He agrees to have screening test for HIV and this will be done with his upcoming lab visit - HIV antibody (with reflex); Future  7. Screening for prostate cancer Patient will have screening test for prostate cancer.  Signs/symptoms of prostate cancer discussed with the patient at today's visit. - PSA; Future   An After  Visit Summary was printed and given to the patient.   Follow-up: Return in about 5 months (around 05/06/2020) for chronic issues; HTN/fasting labs: 3-4 weeks with Luke/CPP.   Jeniya Flannigan,  MD

## 2019-12-05 NOTE — Progress Notes (Signed)
Med refills  Labs  Yearly check up

## 2019-12-17 ENCOUNTER — Telehealth: Payer: Self-pay | Admitting: *Deleted

## 2019-12-17 MED FILL — LOSARTAN POTASSIUM 50 MG TA: 50 | 30 days supply | Qty: 30 | Fill #0

## 2019-12-17 NOTE — Telephone Encounter (Signed)
Can you see if patient's medical record release that he signed at his recent visit has been faxed. Also there is another doctor on [facility], Dr. Benson Norway, and you may want to see if patient was seen at that office for his colonoscopy and see if they can tell you when he had his last colonoscopy if he was seen there. He may need medical record release sent to Dr. Ulyses Amor office but you can also call Dr. Lorie Apley office to see if his colonoscopy was done there   Staff called Dr. Benson Norway and Dr. Lorie Apley office and they are in the same office [phone]. Per receptionist, they do not have this patient coming to them.

## 2019-12-17 NOTE — Telephone Encounter (Signed)
Please see if there are any other GI doctors on [facility] whom patient may have seen for his prior colonoscopy

## 2019-12-18 NOTE — Telephone Encounter (Signed)
Patient said at his last visit that he has had a colonoscopy but we need to find out which doctor he saw so that the medical record release he signed can be sent to his GI doctor to obtain a copy of his colonoscopy results

## 2019-12-18 NOTE — Telephone Encounter (Signed)
Spoke with patient and informed him with what provider stated and he stated that no he is not ready for that yet.

## 2019-12-18 NOTE — Telephone Encounter (Signed)
Spoke with patient and he stated he never did have any colonoscopy before. Provider was informed with information that patient stated.

## 2019-12-26 ENCOUNTER — Ambulatory Visit: Payer: Self-pay | Admitting: Family Medicine

## 2020-01-08 ENCOUNTER — Other Ambulatory Visit: Payer: Self-pay

## 2020-01-08 ENCOUNTER — Ambulatory Visit: Payer: Self-pay | Attending: Family Medicine

## 2020-01-09 ENCOUNTER — Other Ambulatory Visit: Payer: Self-pay

## 2020-01-09 ENCOUNTER — Ambulatory Visit: Payer: Self-pay | Attending: Family Medicine

## 2020-01-09 DIAGNOSIS — I10 Essential (primary) hypertension: Secondary | ICD-10-CM

## 2020-01-09 DIAGNOSIS — Z125 Encounter for screening for malignant neoplasm of prostate: Secondary | ICD-10-CM

## 2020-01-09 DIAGNOSIS — Z1159 Encounter for screening for other viral diseases: Secondary | ICD-10-CM

## 2020-01-09 DIAGNOSIS — E785 Hyperlipidemia, unspecified: Secondary | ICD-10-CM

## 2020-01-09 DIAGNOSIS — Z114 Encounter for screening for human immunodeficiency virus [HIV]: Secondary | ICD-10-CM

## 2020-01-09 DIAGNOSIS — Z79899 Other long term (current) drug therapy: Secondary | ICD-10-CM

## 2020-01-10 LAB — LIPID PANEL
Chol/HDL Ratio: 3.3 ratio (ref 0.0–5.0)
Cholesterol, Total: 201 mg/dL — ABNORMAL HIGH (ref 100–199)
HDL: 61 mg/dL
LDL Chol Calc (NIH): 126 mg/dL — ABNORMAL HIGH (ref 0–99)
Triglycerides: 80 mg/dL (ref 0–149)
VLDL Cholesterol Cal: 14 mg/dL (ref 5–40)

## 2020-01-10 LAB — COMPREHENSIVE METABOLIC PANEL WITH GFR
ALT: 15 IU/L (ref 0–44)
AST: 24 IU/L (ref 0–40)
Albumin/Globulin Ratio: 1.7 (ref 1.2–2.2)
Albumin: 4.8 g/dL (ref 3.8–4.9)
Alkaline Phosphatase: 78 IU/L (ref 48–121)
BUN/Creatinine Ratio: 11 (ref 9–20)
BUN: 10 mg/dL (ref 6–24)
Bilirubin Total: <0.2 mg/dL (ref 0.0–1.2)
CO2: 22 mmol/L (ref 20–29)
Calcium: 9.8 mg/dL (ref 8.7–10.2)
Chloride: 100 mmol/L (ref 96–106)
Creatinine, Ser: 0.94 mg/dL (ref 0.76–1.27)
GFR calc Af Amer: 103 mL/min/1.73
GFR calc non Af Amer: 89 mL/min/1.73
Globulin, Total: 2.8 g/dL (ref 1.5–4.5)
Glucose: 88 mg/dL (ref 65–99)
Potassium: 4.2 mmol/L (ref 3.5–5.2)
Sodium: 137 mmol/L (ref 134–144)
Total Protein: 7.6 g/dL (ref 6.0–8.5)

## 2020-01-10 LAB — PSA: Prostate Specific Ag, Serum: 0.6 ng/mL (ref 0.0–4.0)

## 2020-01-10 LAB — HEPATITIS C ANTIBODY: Hep C Virus Ab: <0.1 {s_co_ratio} (ref 0.0–0.9)

## 2020-01-10 LAB — HIV ANTIBODY (ROUTINE TESTING W REFLEX): HIV Screen 4th Generation wRfx: NONREACTIVE

## 2020-01-13 ENCOUNTER — Other Ambulatory Visit: Payer: Self-pay | Admitting: Internal Medicine

## 2020-01-13 DIAGNOSIS — E785 Hyperlipidemia, unspecified: Secondary | ICD-10-CM

## 2020-01-13 MED ORDER — ATORVASTATIN CALCIUM 40 MG PO TABS: 40.0000 mg | ORAL_TABLET | Freq: Every day | ORAL | 1 refills | Status: DC

## 2020-01-13 MED FILL — ATORVASTATIN CALCIUM 40 MG: 40 | 30 days supply | Qty: 30 | Fill #0

## 2020-01-17 MED FILL — LOSARTAN POTASSIUM 50 MG TA: 50 | 30 days supply | Qty: 30 | Fill #1

## 2020-02-17 MED FILL — LOSARTAN POTASSIUM 50 MG TA: 50 | 30 days supply | Qty: 30 | Fill #2

## 2020-03-19 MED FILL — LOSARTAN POTASSIUM 50 MG TA: 50 | 30 days supply | Qty: 30 | Fill #1

## 2020-04-20 MED FILL — LOSARTAN POTASSIUM 50 MG TA: 50 | 30 days supply | Qty: 30 | Fill #2

## 2020-05-22 ENCOUNTER — Other Ambulatory Visit: Payer: Self-pay | Admitting: Physician Assistant

## 2020-05-22 ENCOUNTER — Other Ambulatory Visit: Payer: Self-pay | Admitting: Family Medicine

## 2020-05-22 DIAGNOSIS — I10 Essential (primary) hypertension: Secondary | ICD-10-CM

## 2020-05-22 MED FILL — LOSARTAN POTASSIUM 50 MG TA: 50 | 30 days supply | Qty: 30 | Fill #0

## 2020-06-30 ENCOUNTER — Other Ambulatory Visit: Payer: Self-pay | Admitting: Pharmacist

## 2020-06-30 ENCOUNTER — Other Ambulatory Visit: Payer: Self-pay | Admitting: Family Medicine

## 2020-06-30 DIAGNOSIS — I10 Essential (primary) hypertension: Secondary | ICD-10-CM

## 2020-06-30 MED ORDER — LOSARTAN POTASSIUM 50 MG PO TABS: 50.0000 mg | ORAL_TABLET | Freq: Every day | ORAL | 0 refills | Status: DC

## 2020-06-30 MED FILL — LOSARTAN POTASSIUM 50 MG TA: 50 | 30 days supply | Qty: 30 | Fill #0

## 2020-08-03 ENCOUNTER — Other Ambulatory Visit: Payer: Self-pay | Admitting: Family Medicine

## 2020-08-03 DIAGNOSIS — I10 Essential (primary) hypertension: Secondary | ICD-10-CM

## 2020-08-03 NOTE — Telephone Encounter (Signed)
Requested medication (s) are due for refill today:  Yes  Requested medication (s) are on the active medication list:  Yes  Future visit scheduled:  No  Last Refill: 06/30/20: #30; no refills  Notes to clinic: call placed to pt.  Left vm to call and sched. F/u appt.  Pt. Was due for f/u in 05/2020.  Has already had a courtesy refill; please advise.   Requested Prescriptions  Pending Prescriptions Disp Refills   losartan (COZAAR) 50 MG tablet [Pharmacy Med Name: LOSARTAN POTASSIUM 50 MG TA 50 Tablet] 30 tablet 0    Sig: Take 1 tablet (50 mg total) by mouth daily.      Cardiovascular:  Angiotensin Receptor Blockers Failed - 08/03/2020  9:30 AM      Failed - Cr in normal range and within 180 days    Creatinine, Ser  Date Value Ref Range Status  01/09/2020 0.94 0.76 - 1.27 mg/dL Final          Failed - K in normal range and within 180 days    Potassium  Date Value Ref Range Status  01/09/2020 4.2 3.5 - 5.2 mmol/L Final          Failed - Last BP in normal range    BP Readings from Last 1 Encounters:  12/05/19 (!) 154/70          Failed - Valid encounter within last 6 months    Recent Outpatient Visits           8 months ago Annual physical exam   [facility] [place], [place], [place]   10 months ago Pure hypercholesterolemia   [facility] [place], [place], [place]   1 year ago Essential hypertension   [facility] [place], [place], [place]   2 years ago Essential hypertension   [facility] [place], [place], [place]   2 years ago Essential hypertension   [facility] [place], [place], [place]                Passed - Patient is not pregnant

## 2020-08-06 ENCOUNTER — Telehealth: Payer: Self-pay | Admitting: Family Medicine

## 2020-08-06 DIAGNOSIS — E785 Hyperlipidemia, unspecified: Secondary | ICD-10-CM

## 2020-08-06 DIAGNOSIS — I10 Essential (primary) hypertension: Secondary | ICD-10-CM

## 2020-08-07 NOTE — Telephone Encounter (Signed)
Requested medications are due for refill today yes  Requested medications are on the active medication list yes  Last refill 11/30  Last visit 11/2019  Future visit scheduled no  Notes to clinic  Has already had a curtesy refill, no upcoming appt scheduled.

## 2020-08-07 NOTE — Telephone Encounter (Signed)
Requested medications are due for refill today yes  Requested medications are on the active medication list yes  Last refill 11/30  Last visit 11/2019  Future visit scheduled no  Notes to clinic Already had curtesy refill, no upcoming visit scheduled.

## 2020-08-13 ENCOUNTER — Other Ambulatory Visit: Payer: Self-pay | Admitting: Family Medicine

## 2020-08-13 MED ORDER — ATORVASTATIN CALCIUM 40 MG PO TABS: 40.0000 mg | ORAL_TABLET | Freq: Every day | ORAL | 2 refills | Status: DC

## 2020-08-13 MED ORDER — LOSARTAN POTASSIUM 50 MG PO TABS: 50.0000 mg | ORAL_TABLET | Freq: Every day | ORAL | 2 refills | Status: DC

## 2020-08-13 NOTE — Addendum Note (Signed)
Addended by: Daisy Blossom, Annie Main L on: 08/13/2020 04:30 PM   Modules accepted: Orders

## 2020-08-13 NOTE — Telephone Encounter (Addendum)
Pt called in to follow up on refill request. Pt says that he is completely out of his medication and would like to have a Rx until his apt if possible. Assisted pt with apt. Pt is scheduled to see Dr. Wynetta Emery due to Dr Chapman Fitch no longer being with practice.     Please assist pt further

## 2020-08-13 NOTE — Telephone Encounter (Signed)
Rx sent 

## 2020-10-02 ENCOUNTER — Ambulatory Visit: Payer: Self-pay | Attending: Internal Medicine | Admitting: Internal Medicine

## 2020-10-02 ENCOUNTER — Other Ambulatory Visit: Payer: Self-pay

## 2020-10-02 ENCOUNTER — Other Ambulatory Visit: Payer: Self-pay | Admitting: Internal Medicine

## 2020-10-02 ENCOUNTER — Encounter: Payer: Self-pay | Admitting: Internal Medicine

## 2020-10-02 VITALS — BP 166/94 | HR 86 | Resp 16 | Wt 190.8 lb

## 2020-10-02 DIAGNOSIS — Z23 Encounter for immunization: Secondary | ICD-10-CM

## 2020-10-02 DIAGNOSIS — Z2821 Immunization not carried out because of patient refusal: Secondary | ICD-10-CM

## 2020-10-02 DIAGNOSIS — E785 Hyperlipidemia, unspecified: Secondary | ICD-10-CM

## 2020-10-02 DIAGNOSIS — Z1211 Encounter for screening for malignant neoplasm of colon: Secondary | ICD-10-CM

## 2020-10-02 DIAGNOSIS — I10 Essential (primary) hypertension: Secondary | ICD-10-CM

## 2020-10-02 MED ORDER — LOSARTAN POTASSIUM 50 MG PO TABS: 50.0000 mg | ORAL_TABLET | Freq: Every day | ORAL | 5 refills | Status: DC

## 2020-10-02 MED ORDER — ATORVASTATIN CALCIUM 40 MG PO TABS: 40.0000 mg | ORAL_TABLET | Freq: Every day | ORAL | 5 refills | Status: DC

## 2020-10-02 NOTE — Progress Notes (Signed)
Patient ID: Arthur Jenkins, male    DOB: August 19, 1960  MRN: [id number]  CC: Hypertension   Subjective: Arthur Jenkins is a 60 y.o. male who presents for chronic disease management.  Previous PCP is Dr. Chapman Fitch who is no longer with the practice. His concerns today include:  Patient with history of HL, HTN,  Patient was upset about long wait time in lobby.  I thanked him for his patients and apologize for the long wait as I did get behind this morning.  HTN:  Out of Cozaar few mths. States he called for RFs but was told he needed to be seen first No CP/SOB/LE edema/HA Limits salt.  He does have a blood pressure monitoring device at home.  HL:  Out of atorvastatin x a few mths for same reason stated above.  He tolerated the medication okay while he was taking it.Arthur Jenkins    HM:  Need COVID booster.  He plans to get this.  Declines flu shot.  Overdue for second Shingrix shot.  He defers having this done today.  Due for colon CA screening.  He will have insurance in a few weeks.  He would like to have colonoscopy scheduled.  He is also requesting appointment to have a physical done. Patient Active Problem List   Diagnosis Date Noted  . Essential hypertension 05/30/2018  . Hyperlipidemia 05/30/2018  . History of shingles 05/30/2018     No current outpatient medications on file prior to visit.   No current facility-administered medications on file prior to visit.    No Known Allergies  Social History   Socioeconomic History  . Marital status: Single    Spouse name: Not on file  . Number of children: Not on file  . Years of education: Not on file  . Highest education level: Not on file  Occupational History  . Not on file  Tobacco Use  . Smoking status: Never Smoker  . Smokeless tobacco: Never Used  Substance and Sexual Activity  . Alcohol use: Yes    Alcohol/week: 3.0 - 4.0 standard drinks    Types: 3 - 4 Cans of beer per week    Comment: a night after work and socially  on weekends  . Drug use: Yes    Types: Marijuana  . Sexual activity: Yes  Other Topics Concern  . Not on file  Social History Narrative  . Not on file   Social Determinants of Health   Financial Resource Strain: Not on file  Food Insecurity: Not on file  Transportation Needs: Not on file  Physical Activity: Not on file  Stress: Not on file  Social Connections: Not on file  Intimate Partner Violence: Not on file    Family History  Problem Relation Age of Onset  . Pancreatic cancer Mother   . Hypertension Neg Hx   . Diabetes Neg Hx   . CAD Neg Hx   . CVA Neg Hx     Past Surgical History:  Procedure Laterality Date  . HERNIA REPAIR      ROS: Review of Systems Negative except as stated above  PHYSICAL EXAM: BP (!) 166/94   Pulse 86   Resp 16   Wt 190 lb 12.8 oz (86.5 kg)   SpO2 98%   BMI 26.24 kg/m   Physical Exam General appearance - alert, well appearing, and in no distress Mental status - normal mood, behavior, speech, dress, motor activity, and thought processes Neck - supple, no significant adenopathy Chest -  clear to auscultation, no wheezes, rales or rhonchi, symmetric air entry Heart - normal rate, regular rhythm, normal S1, S2, no murmurs, rubs, clicks or gallops Extremities - peripheral pulses normal, no pedal edema, no clubbing or cyanosis  CMP Latest Ref Rng & Units 01/09/2020 01/03/2019 05/08/2018  Glucose 65 - 99 mg/dL 88 92 89  BUN 6 - 24 mg/dL 10 12 12   Creatinine 0.76 - 1.27 mg/dL 0.94 0.88 1.05  Sodium 134 - 144 mmol/L 137 138 139  Potassium 3.5 - 5.2 mmol/L 4.2 4.4 4.6  Chloride 96 - 106 mmol/L 100 102 101  CO2 20 - 29 mmol/L 22 23 22   Calcium 8.7 - 10.2 mg/dL 9.8 9.9 10.1  Total Protein 6.0 - 8.5 g/dL 7.6 7.2 -  Total Bilirubin 0.0 - 1.2 mg/dL <0.2 <0.2 -  Alkaline Phos 48 - 121 IU/L 78 74 -  AST 0 - 40 IU/L 24 32 -  ALT 0 - 44 IU/L 15 26 -   Lipid Panel     Component Value Date/Time   CHOL 201 (H) 01/09/2020 0855   TRIG 80  01/09/2020 0855   HDL 61 01/09/2020 0855   CHOLHDL 3.3 01/09/2020 0855   LDLCALC 126 (H) 01/09/2020 0855    CBC No results found for: WBC, RBC, HGB, HCT, PLT, MCV, MCH, MCHC, RDW, LYMPHSABS, MONOABS, EOSABS, BASOSABS  ASSESSMENT AND PLAN: 1. Essential hypertension Not at goal.  Out Losartan x few mths.  Refills submitted. Advised to check blood pressure at least twice a week with goal being 130/80 or lower.  Continue to limit salt in the foods.  Advised to incorporate fresh fruits and vegetables into the diet.  Follow-up with clinical pharmacist in 1 month for repeat blood pressure check - losartan (COZAAR) 50 MG tablet; Take 1 tablet (50 mg total) by mouth daily.  Dispense: 30 tablet; Refill: 5  2. Hyperlipidemia, unspecified hyperlipidemia type - atorvastatin (LIPITOR) 40 MG tablet; Take 1 tablet (40 mg total) by mouth daily. In the evening to reduce cholesterol  Dispense: 30 tablet; Refill: 5  3. Screening for colon cancer Discussed colon cancer screening methods.  Patient prefers to have colonoscopy. - Ambulatory referral to Gastroenterology  4. Influenza vaccination declined   5. COVID-19 vaccine series started   Patient was given the opportunity to ask questions.  Patient verbalized understanding of the plan and was able to repeat key elements of the plan.   Orders Placed This Encounter  Procedures  . Ambulatory referral to Gastroenterology     Requested Prescriptions   Signed Prescriptions Disp Refills  . atorvastatin (LIPITOR) 40 MG tablet 30 tablet 5    Sig: Take 1 tablet (40 mg total) by mouth daily. In the evening to reduce cholesterol  . losartan (COZAAR) 50 MG tablet 30 tablet 5    Sig: Take 1 tablet (50 mg total) by mouth daily.    Return in about 2 months (around 12/02/2020) for for physical.  Please give him the first morning appt.  Karle Plumber, MD, FACP

## 2020-10-09 MED FILL — ATORVASTATIN CALCIUM 40 MG: 40 | 30 days supply | Qty: 30 | Fill #0

## 2020-10-09 MED FILL — LOSARTAN POTASSIUM 50 MG TA: 50 | 30 days supply | Qty: 30 | Fill #0

## 2020-12-01 ENCOUNTER — Other Ambulatory Visit: Payer: Self-pay

## 2020-12-01 ENCOUNTER — Encounter: Payer: Self-pay | Admitting: Internal Medicine

## 2020-12-01 ENCOUNTER — Ambulatory Visit: Payer: Self-pay | Attending: Internal Medicine | Admitting: Internal Medicine

## 2020-12-01 VITALS — BP 160/80 | HR 60 | Resp 16 | Ht 71.0 in | Wt 185.8 lb

## 2020-12-01 DIAGNOSIS — I10 Essential (primary) hypertension: Secondary | ICD-10-CM

## 2020-12-01 DIAGNOSIS — Z23 Encounter for immunization: Secondary | ICD-10-CM

## 2020-12-01 DIAGNOSIS — E7800 Pure hypercholesterolemia, unspecified: Secondary | ICD-10-CM

## 2020-12-01 DIAGNOSIS — Z125 Encounter for screening for malignant neoplasm of prostate: Secondary | ICD-10-CM

## 2020-12-01 DIAGNOSIS — Z0000 Encounter for general adult medical examination without abnormal findings: Secondary | ICD-10-CM

## 2020-12-01 DIAGNOSIS — Z1211 Encounter for screening for malignant neoplasm of colon: Secondary | ICD-10-CM

## 2020-12-01 DIAGNOSIS — E663 Overweight: Secondary | ICD-10-CM

## 2020-12-01 NOTE — Progress Notes (Signed)
Patient ID: Arthur Jenkins, male    DOB: 20-Nov-1960  MRN: [id number]  CC:  physical  Subjective: Arthur Jenkins is a 60 y.o. male who presents for physical.  Last seen 09/2020  His concerns today include:  Patient with history of HL, HTN,  HYPERTENSION Currently taking: see medication list Med Adherence: []  Yes    [x]  No - admits that he has not been taking the Losartan daily. Takes it about 3-4x/wk.  Trying to get into routine of taking daily.  Has a  Med box. Medication side effects: []  Yes    [x]  No Adherence with salt restriction: [x]  Yes    []  No Home Monitoring?: []  Yes    [x]  No but does have a device Monitoring Frequency: []  Yes    []  No Home BP results range: []  Yes    []  No SOB? []  Yes    [x]  No Chest Pain?: []  Yes    [x]  No Leg swelling?: []  Yes    [x]  No Headaches?: []  Yes    [x]  No Dizziness? []  Yes    [x]  No Comments:   HL:  Taking Lipitor consistently every day at nights.  Tolerating med without cramps  HM:  Referred for colonoscopy on last visit.  Called x 2 by GI according to referral note but pt has not return called.  He located # on his phone today and states he will call. No insurance as yet.  Plans to get COVID booster.  Will get 2nd shot of Shingrix  Patient Active Problem List   Diagnosis Date Noted  . Essential hypertension 05/30/2018  . Hyperlipidemia 05/30/2018  . History of shingles 05/30/2018     Current Outpatient Medications on File Prior to Visit  Medication Sig Dispense Refill  . atorvastatin (LIPITOR) 40 MG tablet TAKE 1 TABLET (40 MG TOTAL) BY MOUTH DAILY. IN THE EVENING TO REDUCE CHOLESTEROL 30 tablet 5  . losartan (COZAAR) 50 MG tablet TAKE 1 TABLET (50 MG TOTAL) BY MOUTH DAILY. 30 tablet 5   No current facility-administered medications on file prior to visit.    No Known Allergies  Social History   Socioeconomic History  . Marital status: Single    Spouse name: Not on file  . Number of children: Not on file  . Years  of education: Not on file  . Highest education level: Not on file  Occupational History  . Not on file  Tobacco Use  . Smoking status: Never Smoker  . Smokeless tobacco: Never Used  Substance and Sexual Activity  . Alcohol use: Yes    Alcohol/week: 3.0 - 4.0 standard drinks    Types: 3 - 4 Cans of beer per week    Comment: a night after work and socially on weekends  . Drug use: Yes    Types: Marijuana  . Sexual activity: Yes  Other Topics Concern  . Not on file  Social History Narrative  . Not on file   Social Determinants of Health   Financial Resource Strain: Not on file  Food Insecurity: Not on file  Transportation Needs: Not on file  Physical Activity: Not on file  Stress: Not on file  Social Connections: Not on file  Intimate Partner Violence: Not on file    Family History  Problem Relation Age of Onset  . Pancreatic cancer Mother   . Hypertension Neg Hx   . Diabetes Neg Hx   . CAD Neg Hx   . CVA Neg Hx  Past Surgical History:  Procedure Laterality Date  . HERNIA REPAIR      ROS: Review of Systems  Constitutional:       Doing good with eating habits.  Has not exercise in 3 wks but plans to start again.  Plans to walks and ride stationary bike  HENT: Negative for hearing loss, sinus pressure and sore throat.   Eyes:       Wears glasses.  Last exam was 2+ yrs ago  Respiratory: Negative for cough.   Cardiovascular: Negative for chest pain, palpitations and leg swelling.  Gastrointestinal: Negative for blood in stool.       Moving bowels ok  Endocrine: Negative for polyuria.  Genitourinary: Negative for difficulty urinating and hematuria.  Musculoskeletal: Negative for arthralgias.  Skin: Negative for rash.  Allergic/Immunologic: Negative for food allergies.  Neurological: Negative for dizziness, seizures and headaches.  Psychiatric/Behavioral: Negative for dysphoric mood. The patient is not nervous/anxious.    Negative except as stated  above  PHYSICAL EXAM: BP (!) 160/80   Pulse 60   Resp 16   Ht 5\' 11"  (1.803 m)   Wt 185 lb 12.8 oz (84.3 kg)   SpO2 99%   BMI 25.91 kg/m   Wt Readings from Last 3 Encounters:  12/01/20 185 lb 12.8 oz (84.3 kg)  10/02/20 190 lb 12.8 oz (86.5 kg)  12/05/19 198 lb (89.8 kg)    Physical Exam Constitutional: Appears well-developed and well-nourished. No distress. Head: Normocephalic. Atraumatic Ears: External right and left ear normal.   Eyes: Conjunctivae and EOM are normal. PERRLA, no scleral icterus.  Mouth: no oral lesions, good oral hygiene, throat clear without exudates Neck: Neck supple.  No tracheal deviation. No thyromegaly. No cervical LN CVS: RRR, S1/S2 +, no murmurs, no gallops, no carotid bruit. No JVD Pulmonary: Effort and breath sounds normal, no stridor, rhonchi, wheezes, rales.  Abdominal: Soft. BS +,  no distension, tenderness, rebound or guarding. No organomegaly or masses Musculoskeletal: Normal range of motion. No edema and no tenderness.  Neuro: Alert and oriented x3.  Cns grossly intact, Power: 5/5 BL in all 4s.  Reflexes normal.  Normal  muscle tone, coordination. . Skin: Skin is warm and dry. No rash noted. Not diaphoretic. No erythema. No pallor.  Psychiatric: Normal mood and affect. Behavior, judgment, thought content normal.   Depression screen [facility] 2/9 12/01/2020 12/05/2019 09/25/2019  Decreased Interest 0 0 0  Down, Depressed, Hopeless 0 0 0  PHQ - 2 Score 0 0 0  Altered sleeping - 0 0  Tired, decreased energy - 0 0  Change in appetite - 0 0  Feeling bad or failure about yourself  - 0 0  Trouble concentrating - 0 0  Moving slowly or fidgety/restless - 0 0  Suicidal thoughts - 0 0  PHQ-9 Score - 0 0  Difficult doing work/chores - Not difficult at all -    CMP Latest Ref Rng & Units 01/09/2020 01/03/2019 05/08/2018  Glucose 65 - 99 mg/dL 88 92 89  BUN 6 - 24 mg/dL 10 12 12   Creatinine 0.76 - 1.27 mg/dL 0.94 0.88 1.05  Sodium 134 - 144 mmol/L 137 138 139   Potassium 3.5 - 5.2 mmol/L 4.2 4.4 4.6  Chloride 96 - 106 mmol/L 100 102 101  CO2 20 - 29 mmol/L 22 23 22   Calcium 8.7 - 10.2 mg/dL 9.8 9.9 10.1  Total Protein 6.0 - 8.5 g/dL 7.6 7.2 -  Total Bilirubin 0.0 - 1.2 mg/dL <0.2 <  0.2 -  Alkaline Phos 48 - 121 IU/L 78 74 -  AST 0 - 40 IU/L 24 32 -  ALT 0 - 44 IU/L 15 26 -   Lipid Panel     Component Value Date/Time   CHOL 201 (H) 01/09/2020 0855   TRIG 80 01/09/2020 0855   HDL 61 01/09/2020 0855   CHOLHDL 3.3 01/09/2020 0855   LDLCALC 126 (H) 01/09/2020 0855    CBC No results found for: WBC, RBC, HGB, HCT, PLT, MCV, MCH, MCHC, RDW, LYMPHSABS, MONOABS, EOSABS, BASOSABS  ASSESSMENT AND PLAN:  1. Annual physical exam   2. Essential hypertension Not at goal. Encourage compliance with the Cozaar and low-salt diet.  He is agreeable to following up with the clinical pharmacist in a few weeks.  If blood pressure still not at goal we can increase Cozaar or add another medication like Norvasc or hydrochlorothiazide. - CBC - Comprehensive metabolic panel  3. Overweight (BMI 25.0-29.9) Discussed and encourage healthy eating habits.  Advised to avoid sugary drinks, cut back on portion sizes of white carbohydrates, eat more white lean meat instead of red meat or pork and incorporate fresh fruits and vegetables in the diet every day. -Encourage some form of moderate intensity exercise for total of 150 minutes/week.  4. Pure hypercholesterolemia Continue atorvastatin. - Lipid panel  5. Screening for colon cancer Patient is uninsured and probably will not be able to afford the colonoscopy.  He is agreeable to doing the fit test instead. - Fecal occult blood, imunochemical([facility])  6. Need for COVID-19 vaccine Due for booster.  He is willing to have the vaccine.  I told him how he can access getting the vaccine in the community.  7. Need for shingles vaccine Given today.  8. Screening for prostate cancer Discussed prostate  cancer screening.  Patient willing to be screened. - PSA    Patient was given the opportunity to ask questions.  Patient verbalized understanding of the plan and was able to repeat key elements of the plan.   Orders Placed This Encounter  Procedures  . Fecal occult blood, imunochemical([facility])  . CBC  . Comprehensive metabolic panel  . Lipid panel  . PSA     Requested Prescriptions    No prescriptions requested or ordered in this encounter    Return in about 4 months (around 04/03/2021) for Give appt with [facility] in 2 wks for repeat BP check.  Karle Plumber, MD, FACP

## 2020-12-01 NOTE — Progress Notes (Signed)
Pt states he just took his bp medication at 8am

## 2020-12-01 NOTE — Patient Instructions (Signed)
Check Blood pressure 2 times a week and record reading.  Bring readings with you when you come in 2 weeks for blood pressure recheck.  Healthy Eating Following a healthy eating pattern may help you to achieve and maintain a healthy body weight, reduce the risk of chronic disease, and live a long and productive life. It is important to follow a healthy eating pattern at an appropriate calorie level for your body. Your nutritional needs should be met primarily through food by choosing a variety of nutrient-rich foods. What are tips for following this plan? Reading food labels  Read labels and choose the following: ? Reduced or low sodium. ? Juices with 100% fruit juice. ? Foods with low saturated fats and high polyunsaturated and monounsaturated fats. ? Foods with whole grains, such as whole wheat, cracked wheat, brown rice, and wild rice. ? Whole grains that are fortified with folic acid. This is recommended for women who are pregnant or who want to become pregnant.  Read labels and avoid the following: ? Foods with a lot of added sugars. These include foods that contain brown sugar, corn sweetener, corn syrup, dextrose, fructose, glucose, high-fructose corn syrup, honey, invert sugar, lactose, malt syrup, maltose, molasses, raw sugar, sucrose, trehalose, or turbinado sugar.  Do not eat more than the following amounts of added sugar per day:  6 teaspoons (25 g) for women.  9 teaspoons (38 g) for men. ? Foods that contain processed or refined starches and grains. ? Refined grain products, such as white flour, degermed cornmeal, white bread, and white rice. Shopping  Choose nutrient-rich snacks, such as vegetables, whole fruits, and nuts. Avoid high-calorie and high-sugar snacks, such as potato chips, fruit snacks, and candy.  Use oil-based dressings and spreads on foods instead of solid fats such as butter, stick margarine, or cream cheese.  Limit pre-made sauces, mixes, and "instant"  products such as flavored rice, instant noodles, and ready-made pasta.  Try more plant-protein sources, such as tofu, tempeh, black beans, edamame, lentils, nuts, and seeds.  Explore eating plans such as the Mediterranean diet or vegetarian diet. Cooking  Use oil to saut or stir-fry foods instead of solid fats such as butter, stick margarine, or lard.  Try baking, boiling, grilling, or broiling instead of frying.  Remove the fatty part of meats before cooking.  Steam vegetables in water or broth. Meal planning  At meals, imagine dividing your plate into fourths: ? One-half of your plate is fruits and vegetables. ? One-fourth of your plate is whole grains. ? One-fourth of your plate is protein, especially lean meats, poultry, eggs, tofu, beans, or nuts.  Include low-fat dairy as part of your daily diet.   Lifestyle  Choose healthy options in all settings, including home, work, school, restaurants, or stores.  Prepare your food safely: ? Wash your hands after handling raw meats. ? Keep food preparation surfaces clean by regularly washing with hot, soapy water. ? Keep raw meats separate from ready-to-eat foods, such as fruits and vegetables. ? Cook seafood, meat, poultry, and eggs to the recommended internal temperature. ? Store foods at safe temperatures. In general:  Keep cold foods at 74F (4.4C) or below.  Keep hot foods at 174F (60C) or above.  Keep your freezer at [facility] (-17.8C) or below.  Foods are no longer safe to eat when they have been between the temperatures of 40-174F (4.4-60C) for more than 2 hours. What foods should I eat? Fruits Aim to eat 2 cup-equivalents of fresh, canned (  in natural juice), or frozen fruits each day. Examples of 1 cup-equivalent of fruit include 1 small apple, 8 large strawberries, 1 cup canned fruit,  cup dried fruit, or 1 cup 100% juice. Vegetables Aim to eat 2-3 cup-equivalents of fresh and frozen vegetables each day, including  different varieties and colors. Examples of 1 cup-equivalent of vegetables include 2 medium carrots, 2 cups raw, leafy greens, 1 cup chopped vegetable (raw or cooked), or 1 medium baked potato. Grains Aim to eat 6 ounce-equivalents of whole grains each day. Examples of 1 ounce-equivalent of grains include 1 slice of bread, 1 cup ready-to-eat cereal, 3 cups popcorn, or  cup cooked rice, pasta, or cereal. Meats and other proteins Aim to eat 5-6 ounce-equivalents of protein each day. Examples of 1 ounce-equivalent of protein include 1 egg, 1/2 cup nuts or seeds, or 1 tablespoon (16 g) peanut butter. A cut of meat or fish that is the size of a deck of cards is about 3-4 ounce-equivalents.  Of the protein you eat each week, try to have at least 8 ounces come from seafood. This includes salmon, trout, herring, and anchovies. Dairy Aim to eat 3 cup-equivalents of fat-free or low-fat dairy each day. Examples of 1 cup-equivalent of dairy include 1 cup (240 mL) milk, 8 ounces (250 g) yogurt, 1 ounces (44 g) natural cheese, or 1 cup (240 mL) fortified soy milk. Fats and oils  Aim for about 5 teaspoons (21 g) per day. Choose monounsaturated fats, such as canola and olive oils, avocados, peanut butter, and most nuts, or polyunsaturated fats, such as sunflower, corn, and soybean oils, walnuts, pine nuts, sesame seeds, sunflower seeds, and flaxseed. Beverages  Aim for six 8-oz glasses of water per day. Limit coffee to three to five 8-oz cups per day.  Limit caffeinated beverages that have added calories, such as soda and energy drinks.  Limit alcohol intake to no more than 1 drink a day for nonpregnant women and 2 drinks a day for men. One drink equals 12 oz of beer (355 mL), 5 oz of wine (148 mL), or 1 oz of hard liquor (44 mL). Seasoning and other foods  Avoid adding excess amounts of salt to your foods. Try flavoring foods with herbs and spices instead of salt.  Avoid adding sugar to foods.  Try  using oil-based dressings, sauces, and spreads instead of solid fats. This information is based on general [place] nutrition guidelines. For more information, visit [employer].es. Exact amounts may vary based on your nutrition needs. Summary  A healthy eating plan may help you to maintain a healthy weight, reduce the risk of chronic diseases, and stay active throughout your life.  Plan your meals. Make sure you eat the right portions of a variety of nutrient-rich foods.  Try baking, boiling, grilling, or broiling instead of frying.  Choose healthy options in all settings, including home, work, school, restaurants, or stores. This information is not intended to replace advice given to you by your health care provider. Make sure you discuss any questions you have with your health care provider. Document Revised: 10/30/2017 Document Reviewed: 10/30/2017 Elsevier Patient Education  [place].

## 2020-12-14 NOTE — Progress Notes (Signed)
PCP: Dr. Wynetta Emery   S:    Patient arrives in good spirits but states he is in a hurry to get to work. Presents to the clinic for hypertension evaluation, counseling, and management. Patient was referred and last seen by Primary Care Provider on 12/01/20 for annual physical. Pt endorsed he was nonadherent to htn medication. PCP continued current regimen.   During today's visit he has no complaints about ADEs but has been drinking more water which has made him urinate more frequently.  Pt reports he is taking his BP medication as prescribed but often misses his atorvastatin 40 mg daily.   Current BP Medications include:  Losartan 50 mg tab- Take 1 tablet PO daily   Antihypertensives tried in the past include: as stated above   Dietary habits include: Endorses his diet has improved since last visit with PCP. States he has tried to make a transition from fried foods to baked/grilled fish and chicken. Pt states he drinks a lot of Gatorade and has tried to decrease his intake.  Exercise habits include: Endorses he exercises 30 min/day  Family / Social history: Pt currently does detailing and landscaping work  Alcohol: States he drinks 3 - 4 cans of beer on the weekends Smoker: never smoker   O:  Vitals:   12/15/20 0951  BP: (!) 177/84   Home BP readings: states they avg. Around 140/80s  Last 3 Office BP readings: BP Readings from Last 3 Encounters:  12/15/20 (!) 177/84  12/01/20 (!) 160/80  10/02/20 (!) 166/94   BMET    Component Value Date/Time   NA 137 01/09/2020 0855   K 4.2 01/09/2020 0855   CL 100 01/09/2020 0855   CO2 22 01/09/2020 0855   GLUCOSE 88 01/09/2020 0855   BUN 10 01/09/2020 0855   CREATININE 0.94 01/09/2020 0855   CALCIUM 9.8 01/09/2020 0855   GFRNONAA 89 01/09/2020 0855   GFRAA 103 01/09/2020 0855   Renal function: CrCl cannot be calculated (Patient's most recent lab result is older than the maximum 21 days allowed.).   Clinical ASCVD: No  The 10-year  ASCVD risk score Mikey Bussing DC Jr., et al., 2013) is: 21.5%   Values used to calculate the score:     Age: 60 years     Sex: Male     Is Non-Hispanic African American: Yes     Diabetic: No     Tobacco smoker: No     Systolic Blood Pressure: 182 mmHg     Is BP treated: Yes     HDL Cholesterol: 61 mg/dL     Total Cholesterol: 201 mg/dL  A/P: Longstanding uncontrolled Stage II hypertension currently on current medication. BP Goal = <130/80 mmHg. Medication adherence reported.  - Continue Losartan 50 mg tab - Take 1 tablet PO daily  - Initiate Amlodipine 5 mg tab - Take 1 tablet PO daily  - Will reassess patient after 2 - 4 weeks and titrate dose up to 10 mg as needed. Monitor HR, signs/sx of dizziness, HA and peripheral edema.  - Counseled on lifestyle modifications:  - Per the 2017 AHA guidelines Men should have </= 2 standard alcoholic drinks (  12 oz regular beer (5% alcohol), 5 oz wine (12% alcohol), or 1.5 oz of distilled spirits (40% alcohol).   - Dietary sodium intake of 1000- 1500 mg/day   - Increase exercise 90 -150 mins/week,  - Utilize DASH diet    - Obtain  adequate sleep.  Results reviewed and  written information provided. Total time in face-to-face counseling 15  minutes.   F/U Clinic Visit in 1 month with [facility] .    Patient seen with [place] Hermila Millis, [place] D [employer] 23'   Benard Halsted, PharmD, [place], [place] [phone]

## 2020-12-15 ENCOUNTER — Ambulatory Visit: Payer: Self-pay

## 2020-12-15 ENCOUNTER — Other Ambulatory Visit: Payer: Self-pay

## 2020-12-15 ENCOUNTER — Encounter: Payer: Self-pay | Admitting: Pharmacist

## 2020-12-15 ENCOUNTER — Ambulatory Visit: Payer: Self-pay | Attending: Internal Medicine | Admitting: Pharmacist

## 2020-12-15 VITALS — BP 177/84

## 2020-12-15 DIAGNOSIS — I10 Essential (primary) hypertension: Secondary | ICD-10-CM

## 2020-12-15 MED ORDER — AMLODIPINE BESYLATE 5 MG PO TABS
5.0000 mg | ORAL_TABLET | Freq: Every day | ORAL | 2 refills | Status: DC
Start: 2020-12-15 — End: 2021-07-19
  Filled 2020-12-15: qty 30, 30d supply, fill #0
  Filled 2021-07-15: qty 30, 30d supply, fill #1

## 2020-12-16 ENCOUNTER — Other Ambulatory Visit: Payer: Self-pay

## 2020-12-16 LAB — LIPID PANEL
Chol/HDL Ratio: 3.1 ratio (ref 0.0–5.0)
Cholesterol, Total: 193 mg/dL (ref 100–199)
HDL: 63 mg/dL (ref >39)
LDL Chol Calc (NIH): 122 mg/dL — ABNORMAL HIGH (ref 0–99)
Triglycerides: 43 mg/dL (ref 0–149)
VLDL Cholesterol Cal: 8 mg/dL (ref 5–40)

## 2020-12-16 LAB — COMPREHENSIVE METABOLIC PANEL
ALT: 17 IU/L (ref 0–44)
AST: 26 IU/L (ref 0–40)
Albumin/Globulin Ratio: 1.8 (ref 1.2–2.2)
Albumin: 4.9 g/dL (ref 3.8–4.9)
Alkaline Phosphatase: 87 IU/L (ref 44–121)
BUN/Creatinine Ratio: 16 (ref 9–20)
BUN: 15 mg/dL (ref 6–24)
Bilirubin Total: 0.3 mg/dL (ref 0.0–1.2)
CO2: 25 mmol/L (ref 20–29)
Calcium: 10 mg/dL (ref 8.7–10.2)
Chloride: 99 mmol/L (ref 96–106)
Creatinine, Ser: 0.95 mg/dL (ref 0.76–1.27)
Globulin, Total: 2.8 g/dL (ref 1.5–4.5)
Glucose: 90 mg/dL (ref 65–99)
Potassium: 4.3 mmol/L (ref 3.5–5.2)
Sodium: 137 mmol/L (ref 134–144)
Total Protein: 7.7 g/dL (ref 6.0–8.5)
eGFR: 92 mL/min/{1.73_m2} (ref >59)

## 2020-12-16 LAB — CBC
Hematocrit: 41.2 % (ref 37.5–51.0)
Hemoglobin: 13.7 g/dL (ref 13.0–17.7)
MCH: 30 pg (ref 26.6–33.0)
MCHC: 33.3 g/dL (ref 31.5–35.7)
MCV: 90 fL (ref 79–97)
Platelets: 230 10*3/uL (ref 150–450)
RBC: 4.56 x10E6/uL (ref 4.14–5.80)
RDW: 12.7 % (ref 11.6–15.4)
WBC: 4.4 10*3/uL (ref 3.4–10.8)

## 2020-12-16 LAB — PSA: Prostate Specific Ag, Serum: 0.7 ng/mL (ref 0.0–4.0)

## 2020-12-16 MED FILL — Losartan Potassium Tab 50 MG: ORAL | 30 days supply | Qty: 30 | Fill #0 | Status: CN

## 2020-12-17 ENCOUNTER — Encounter: Payer: Self-pay | Admitting: Internal Medicine

## 2020-12-17 DIAGNOSIS — R195 Other fecal abnormalities: Secondary | ICD-10-CM | POA: Insufficient documentation

## 2020-12-17 LAB — FECAL OCCULT BLOOD, IMMUNOCHEMICAL: Fecal Occult Bld: POSITIVE — AB

## 2020-12-17 NOTE — Progress Notes (Signed)
Let patient know that his fecal occult test was positive.  This means he will need to have a colonoscopy done to evaluate the colon for any signs of colon cancer.  He should apply for the orange card/cone discount card ASAP and let me know once he has been approved so that I can submit the referral for him to have the colonoscopy.

## 2020-12-21 ENCOUNTER — Encounter: Payer: Self-pay | Admitting: Gastroenterology

## 2020-12-21 ENCOUNTER — Telehealth: Payer: Self-pay | Admitting: Internal Medicine

## 2020-12-21 ENCOUNTER — Telehealth: Payer: Self-pay

## 2020-12-21 NOTE — Telephone Encounter (Signed)
Copied from [place] [id number]. Topic: General - Inquiry >> Dec 21, 2020  3:10 PM Greggory Keen D wrote: Reason for CRM: Pt called asking where he was supposed to be referred to for colonoscopy.  CB#  [phone]

## 2020-12-21 NOTE — Telephone Encounter (Signed)
Contacted pt to go over lab/fit results pt is aware and doesn't have any questions or concerns

## 2020-12-22 NOTE — Telephone Encounter (Signed)
Patient is already schedule  with Rudolpho Sevin  on 6/30 @ 1:30PM   for pre visit  . After that he is schedule for his Colonoscopy procedure

## 2020-12-23 ENCOUNTER — Other Ambulatory Visit: Payer: Self-pay

## 2020-12-23 ENCOUNTER — Telehealth: Payer: Self-pay | Admitting: Internal Medicine

## 2020-12-23 NOTE — Telephone Encounter (Signed)
Copied from [place] [phone]. Topic: General - Other >> Dec 22, 2020  4:22 PM Pawlus, Brayton Layman A wrote: Reason for CRM: Pt wanted to go over his recent lab results, he did not fully understand the positive result.

## 2020-12-23 NOTE — Telephone Encounter (Signed)
Returned pt call and lvm

## 2020-12-25 ENCOUNTER — Other Ambulatory Visit: Payer: Self-pay

## 2020-12-25 ENCOUNTER — Encounter (INDEPENDENT_AMBULATORY_CARE_PROVIDER_SITE_OTHER): Payer: Self-pay

## 2020-12-25 MED FILL — Losartan Potassium Tab 50 MG: ORAL | 30 days supply | Qty: 30 | Fill #0 | Status: AC

## 2021-01-15 ENCOUNTER — Ambulatory Visit: Payer: Self-pay | Admitting: Pharmacist

## 2021-01-28 ENCOUNTER — Ambulatory Visit: Payer: Self-pay

## 2021-01-29 ENCOUNTER — Ambulatory Visit (AMBULATORY_SURGERY_CENTER): Payer: Self-pay

## 2021-01-29 ENCOUNTER — Other Ambulatory Visit: Payer: Self-pay

## 2021-01-29 VITALS — Ht 71.0 in | Wt 180.0 lb

## 2021-01-29 DIAGNOSIS — Z1211 Encounter for screening for malignant neoplasm of colon: Secondary | ICD-10-CM

## 2021-01-29 MED ORDER — NA SULFATE-K SULFATE-MG SULF 17.5-3.13-1.6 GM/177ML PO SOLN
1.0000 | Freq: Once | ORAL | 0 refills | Status: AC
  Filled 2021-01-29: qty 354, 30d supply, fill #0

## 2021-01-29 NOTE — Progress Notes (Signed)
No egg or soy allergy known to patient  No issues with past sedation with any surgeries or procedures Patient denies ever being told they had issues or difficulty with intubation  No FH of Malignant Hyperthermia No diet pills per patient No home 02 use per patient  No blood thinners per patient  Pt denies issues with constipation  No A fib or A flutter  EMMI video to pt or via [place] 19 guidelines implemented in PV today with Pt and RN  Pt is fully vaccinated  for [employer].    NO PA's for preps discussed with pt In PV today  Discussed with pt there will be an out-of-pocket cost for prep and that varies from $0 to 70 dollars   Due to the COVID-19 pandemic we are asking patients to follow certain guidelines.  Pt aware of COVID protocols and [facility] guidelines

## 2021-02-03 ENCOUNTER — Other Ambulatory Visit: Payer: Self-pay

## 2021-02-18 ENCOUNTER — Ambulatory Visit (AMBULATORY_SURGERY_CENTER): Payer: 59 | Admitting: Gastroenterology

## 2021-02-18 ENCOUNTER — Other Ambulatory Visit: Payer: Self-pay

## 2021-02-18 ENCOUNTER — Encounter: Payer: Self-pay | Admitting: Gastroenterology

## 2021-02-18 VITALS — BP 126/70 | HR 47 | Temp 97.8°F | Resp 9 | Ht 71.0 in | Wt 180.0 lb

## 2021-02-18 DIAGNOSIS — D122 Benign neoplasm of ascending colon: Secondary | ICD-10-CM

## 2021-02-18 DIAGNOSIS — D125 Benign neoplasm of sigmoid colon: Secondary | ICD-10-CM

## 2021-02-18 DIAGNOSIS — D120 Benign neoplasm of cecum: Secondary | ICD-10-CM

## 2021-02-18 DIAGNOSIS — Z1211 Encounter for screening for malignant neoplasm of colon: Secondary | ICD-10-CM

## 2021-02-18 DIAGNOSIS — D123 Benign neoplasm of transverse colon: Secondary | ICD-10-CM | POA: Diagnosis not present

## 2021-02-18 MED ORDER — SODIUM CHLORIDE 0.9 % IV SOLN: 500.0000 mL | Freq: Once | INTRAVENOUS | Status: DC

## 2021-02-18 NOTE — Op Note (Signed)
[facility] Patient Name: Arthur Jenkins Procedure Date: 02/18/2021 10:58 AM MRN: [id number] Endoscopist: [place]. Loletha Carrow , MD Age: 60 Referring MD:  Date of Birth: 02-May-1961 Gender: Male Account #: [id number] Procedure:                Colonoscopy Indications:              Screening for colorectal malignant neoplasm, This                            is the patient's first colonoscopy Medicines:                Monitored Anesthesia Care Procedure:                Pre-Anesthesia Assessment:                           - Prior to the procedure, a History and Physical                            was performed, and patient medications and                            allergies were reviewed. The patient's tolerance of                            previous anesthesia was also reviewed. The risks                            and benefits of the procedure and the sedation                            options and risks were discussed with the patient.                            All questions were answered, and informed consent                            was obtained. Prior Anticoagulants: The patient has                            taken no previous anticoagulant or antiplatelet                            agents. ASA Grade Assessment: II - A patient with                            mild systemic disease. After reviewing the risks                            and benefits, the patient was deemed in                            satisfactory condition to undergo the procedure.  After obtaining informed consent, the colonoscope                            was passed under direct vision. Throughout the                            procedure, the patient's blood pressure, pulse, and                            oxygen saturations were monitored continuously. The                            CF [id number] #[id number] was introduced through the anus                            and advanced to the  the cecum, identified by                            appendiceal orifice and ileocecal valve. The                            colonoscopy was performed without difficulty. The                            patient tolerated the procedure well. The quality                            of the bowel preparation was good (lavage required                            in some areas due to retained fibrous food debris).                            The ileocecal valve, appendiceal orifice, and                            rectum were photographed. The bowel preparation                            used was SUPREP. Scope In: 11:11:45 AM Scope Out: 11:35:33 AM Scope Withdrawal Time: 0 hours 21 minutes 24 seconds  Total Procedure Duration: 0 hours 23 minutes 48 seconds  Findings:                 The perianal and digital rectal examinations were                            normal.                           Three sessile polyps were found in the mid sigmoid                            colon, proximal ascending colon and cecum. The  polyps were diminutive in size. These polyps were                            removed with a cold snare. Resection and retrieval                            were complete.                           A 12 mm polyp was found in the mid transverse                            colon. The polyp was pedunculated. The polyp was                            removed with a hot snare. Resection and retrieval                            were complete.                           The exam was otherwise without abnormality on                            direct and retroflexion views. Complications:            No immediate complications. Estimated Blood Loss:     Estimated blood loss was minimal. Impression:               - Three diminutive polyps in the mid sigmoid colon,                            in the proximal ascending colon and in the cecum,                            removed  with a cold snare. Resected and retrieved.                           - One 12 mm polyp in the mid transverse colon,                            removed with a hot snare. Resected and retrieved.                           - The examination was otherwise normal on direct                            and retroflexion views. Recommendation:           - Patient has a contact number available for                            emergencies. The signs and symptoms of potential  delayed complications were discussed with the                            patient. Return to normal activities tomorrow.                            Written discharge instructions were provided to the                            patient.                           - Resume previous diet.                           - Continue present medications.                           - Await pathology results.                           - Repeat colonoscopy is recommended for                            surveillance. The colonoscopy date will be                            determined after pathology results from today's                            exam become available for review. Marcelyn Ruppe L. Loletha Carrow, MD 02/18/2021 11:40:58 AM This report has been signed electronically.

## 2021-02-18 NOTE — Progress Notes (Signed)
A/ox3, pleased with MAC, report to RN 

## 2021-02-18 NOTE — Progress Notes (Signed)
Called to room to assist during endoscopic procedure.  Patient ID and intended procedure confirmed with present staff. Received instructions for my participation in the procedure from the performing physician.  

## 2021-02-18 NOTE — Patient Instructions (Signed)
YOU HAD AN ENDOSCOPIC PROCEDURE TODAY AT THE [facility]:   Refer to the procedure report that was given to you for any specific questions about what was found during the examination.  If the procedure report does not answer your questions, please call your gastroenterologist to clarify.  If you requested that your care partner not be given the details of your procedure findings, then the procedure report has been included in a sealed envelope for you to review at your convenience later.  YOU SHOULD EXPECT: Some feelings of bloating in the abdomen. Passage of more gas than usual.  Walking can help get rid of the air that was put into your GI tract during the procedure and reduce the bloating. If you had a lower endoscopy (such as a colonoscopy or flexible sigmoidoscopy) you may notice spotting of blood in your stool or on the toilet paper. If you underwent a bowel prep for your procedure, you may not have a normal bowel movement for a few days.  Please Note:  You might notice some irritation and congestion in your nose or some drainage.  This is from the oxygen used during your procedure.  There is no need for concern and it should clear up in a day or so.  SYMPTOMS TO REPORT IMMEDIATELY:   Following lower endoscopy (colonoscopy or flexible sigmoidoscopy):  Excessive amounts of blood in the stool  Significant tenderness or worsening of abdominal pains  Swelling of the abdomen that is new, acute  Fever of 100F or higher   Following upper endoscopy (EGD)  Vomiting of blood or coffee ground material  New chest pain or pain under the shoulder blades  Painful or persistently difficult swallowing  New shortness of breath  Fever of 100F or higher  Black, tarry-looking stools  For urgent or emergent issues, a gastroenterologist can be reached at any hour by calling [phone]. Do not use MyChart messaging for urgent concerns.    DIET:  We do recommend a small meal at first, but  then you may proceed to your regular diet.  Drink plenty of fluids but you should avoid alcoholic beverages for 24 hours.  ACTIVITY:  You should plan to take it easy for the rest of today and you should NOT DRIVE or use heavy machinery until tomorrow (because of the sedation medicines used during the test).    FOLLOW UP: Our staff will call the number listed on your records 48-72 hours following your procedure to check on you and address any questions or concerns that you may have regarding the information given to you following your procedure. If we do not reach you, we will leave a message.  We will attempt to reach you two times.  During this call, we will ask if you have developed any symptoms of COVID 19. If you develop any symptoms (ie: fever, flu-like symptoms, shortness of breath, cough etc.) before then, please call [phone].  If you test positive for Covid 19 in the 2 weeks post procedure, please call and report this information to us.    If any biopsies were taken you will be contacted by phone or by letter within the next 1-3 weeks.  Please call us at [phone] if you have not heard about the biopsies in 3 weeks.    SIGNATURES/CONFIDENTIALITY: You and/or your care partner have signed paperwork which will be entered into your electronic medical record.  These signatures attest to the fact that that the information above on   your After Visit Summary has been reviewed and is understood.  Full responsibility of the confidentiality of this discharge information lies with you and/or your care-partner. 

## 2021-02-18 NOTE — Progress Notes (Signed)
Medical history reviewed with no changes noted. VS assessed by C.W 

## 2021-02-25 ENCOUNTER — Encounter: Payer: Self-pay | Admitting: Gastroenterology

## 2021-04-06 ENCOUNTER — Ambulatory Visit: Payer: Self-pay | Admitting: Internal Medicine

## 2021-07-15 ENCOUNTER — Other Ambulatory Visit: Payer: Self-pay

## 2021-07-15 MED FILL — Atorvastatin Calcium Tab 40 MG (Base Equivalent): ORAL | 30 days supply | Qty: 30 | Fill #0 | Status: AC

## 2021-07-15 MED FILL — Losartan Potassium Tab 50 MG: ORAL | 30 days supply | Qty: 30 | Fill #1 | Status: AC

## 2021-07-19 ENCOUNTER — Ambulatory Visit: Payer: 59 | Attending: Internal Medicine | Admitting: Internal Medicine

## 2021-07-19 ENCOUNTER — Other Ambulatory Visit: Payer: Self-pay

## 2021-07-19 ENCOUNTER — Encounter: Payer: Self-pay | Admitting: Internal Medicine

## 2021-07-19 VITALS — BP 152/85 | HR 66 | Resp 16 | Wt 182.0 lb

## 2021-07-19 DIAGNOSIS — D126 Benign neoplasm of colon, unspecified: Secondary | ICD-10-CM | POA: Diagnosis not present

## 2021-07-19 DIAGNOSIS — E7800 Pure hypercholesterolemia, unspecified: Secondary | ICD-10-CM | POA: Diagnosis not present

## 2021-07-19 DIAGNOSIS — I10 Essential (primary) hypertension: Secondary | ICD-10-CM

## 2021-07-19 MED ORDER — AMLODIPINE BESYLATE 5 MG PO TABS
5.0000 mg | ORAL_TABLET | Freq: Every day | ORAL | 5 refills | Status: DC
Start: 2021-07-19 — End: 2021-11-29
  Filled 2021-07-19 – 2021-11-19 (×2): qty 30, 30d supply, fill #0

## 2021-07-19 MED ORDER — ATORVASTATIN CALCIUM 40 MG PO TABS
ORAL_TABLET | ORAL | 5 refills | Status: DC
  Filled 2021-07-19: qty 30, fill #0
  Filled 2021-11-19: qty 30, 30d supply, fill #0
  Filled 2022-01-06: qty 30, 30d supply, fill #1
  Filled 2022-03-16: qty 30, 30d supply, fill #2
  Filled 2022-06-27: qty 30, 30d supply, fill #3

## 2021-07-19 MED ORDER — LOSARTAN POTASSIUM 50 MG PO TABS
ORAL_TABLET | Freq: Every day | ORAL | 5 refills | Status: DC
  Filled 2021-07-19: qty 30, fill #0
  Filled 2021-09-17: qty 30, 30d supply, fill #0
  Filled 2021-11-19: qty 30, 30d supply, fill #1
  Filled 2021-12-31: qty 30, 30d supply, fill #2
  Filled 2022-03-16: qty 30, 30d supply, fill #3
  Filled 2022-04-25: qty 30, 30d supply, fill #4
  Filled 2022-05-27: qty 30, 30d supply, fill #5

## 2021-07-19 NOTE — Patient Instructions (Signed)
Your blood pressure is not at goal.  Try to check your blood pressure at least once a week.  Your goal is 130/80 or lower.

## 2021-07-19 NOTE — Progress Notes (Signed)
Patient ID: Arthur Jenkins, male    DOB: 1961-07-05  MRN: [id number]  CC: Hypertension   Subjective: Arthur Jenkins is a 60 y.o. male who presents for chronic ds management His concerns today include:  Patient with history of HL, HTN,   HYPERTENSION Currently taking: see medication list.  He is on amlodipine and Cozaar.  He was out of them for several months but recently got them refilled. Med Adherence: [x]  Yes  - but did not take as yet for today.     Medication side effects: []  Yes    [x]  No Adherence with salt restriction: [x]  Yes    []  No Home Monitoring?: []  Yes    [x]  No but has checked in a while Monitoring Frequency:  Home BP results range:  SOB? []  Yes    [x]  No Chest Pain?: []  Yes    [x]  No Leg swelling?: []  Yes    [x]  No Headaches?: []  Yes    [x]  No Dizziness? []  Yes    [x]  No Comments:  walking for 30 mins 3 days a wk for exercise.  HL:  just started back taking Lipitor after being out of it for several mths  HM: Had colonoscopy since last visit with me.  He had several polyps removed that were tubular adenomas.  He is due again in 3 years.  Patient Active Problem List   Diagnosis Date Noted   Positive FIT (fecal immunochemical test) 12/17/2020   Essential hypertension 05/30/2018   Hyperlipidemia 05/30/2018   History of shingles 05/30/2018     No current outpatient medications on file prior to visit.   No current facility-administered medications on file prior to visit.    No Known Allergies  Social History   Socioeconomic History   Marital status: Single    Spouse name: Not on file   Number of children: Not on file   Years of education: Not on file   Highest education level: Not on file  Occupational History   Not on file  Tobacco Use   Smoking status: Never    Passive exposure: Past (roommate for 1 1/2 years)   Smokeless tobacco: Never  Vaping Use   Vaping Use: Never used  Substance and Sexual Activity   Alcohol use: Yes     Alcohol/week: 3.0 - 4.0 standard drinks    Types: 3 - 4 Cans of beer per week    Comment: a night after work and socially on weekends   Drug use: Yes    Types: Marijuana    Comment: smoking   Sexual activity: Yes  Other Topics Concern   Not on file  Social History Narrative   Not on file   Social Determinants of Health   Financial Resource Strain: Not on file  Food Insecurity: Not on file  Transportation Needs: Not on file  Physical Activity: Not on file  Stress: Not on file  Social Connections: Not on file  Intimate Partner Violence: Not on file    Family History  Problem Relation Age of Onset   Pancreatic cancer Mother    Hypertension Neg Hx    Diabetes Neg Hx    CAD Neg Hx    CVA Neg Hx    Colon cancer Neg Hx    Colon polyps Neg Hx    Esophageal cancer Neg Hx    Rectal cancer Neg Hx    Stomach cancer Neg Hx     Past Surgical History:  Procedure Laterality Date  HERNIA REPAIR  1999   abdominal    ROS: Review of Systems Negative except as stated above  PHYSICAL EXAM: BP (!) 152/85    Pulse 66    Resp 16    Wt 182 lb (82.6 kg)    SpO2 99%    BMI 25.38 kg/m   Physical Exam BP 150/92 General appearance - alert, well appearing, and in no distress Mental status - normal mood, behavior, speech, dress, motor activity, and thought processes Chest - clear to auscultation, no wheezes, rales or rhonchi, symmetric air entry Heart - normal rate, regular rhythm, normal S1, S2, no murmurs, rubs, clicks or gallops Extremities - peripheral pulses normal, no pedal edema, no clubbing or cyanosis   CMP Latest Ref Rng & Units 12/15/2020 01/09/2020 01/03/2019  Glucose 65 - 99 mg/dL 90 88 92  BUN 6 - 24 mg/dL 15 10 12   Creatinine 0.76 - 1.27 mg/dL 0.95 0.94 0.88  Sodium 134 - 144 mmol/L 137 137 138  Potassium 3.5 - 5.2 mmol/L 4.3 4.2 4.4  Chloride 96 - 106 mmol/L 99 100 102  CO2 20 - 29 mmol/L 25 22 23   Calcium 8.7 - 10.2 mg/dL 10.0 9.8 9.9  Total Protein 6.0 - 8.5 g/dL 7.7  7.6 7.2  Total Bilirubin 0.0 - 1.2 mg/dL 0.3 <0.2 <0.2  Alkaline Phos 44 - 121 IU/L 87 78 74  AST 0 - 40 IU/L 26 24 32  ALT 0 - 44 IU/L 17 15 26    Lipid Panel     Component Value Date/Time   CHOL 193 12/15/2020 0920   TRIG 43 12/15/2020 0920   HDL 63 12/15/2020 0920   CHOLHDL 3.1 12/15/2020 0920   LDLCALC 122 (H) 12/15/2020 0920    CBC    Component Value Date/Time   WBC 4.4 12/15/2020 0920   RBC 4.56 12/15/2020 0920   HGB 13.7 12/15/2020 0920   HCT 41.2 12/15/2020 0920   PLT 230 12/15/2020 0920   MCV 90 12/15/2020 0920   MCH 30.0 12/15/2020 0920   MCHC 33.3 12/15/2020 0920   RDW 12.7 12/15/2020 0920    ASSESSMENT AND PLAN:  1. Essential hypertension Not at goal.  Patient has not taken his medicines as yet for today.  He will take them as soon as he returns home.  Advised to check blood pressure at least once a week with goal being 130/80 or lower. - amLODipine (NORVASC) 5 MG tablet; Take 1 tablet (5 mg total) by mouth daily.  Dispense: 30 tablet; Refill: 5 - losartan (COZAAR) 50 MG tablet; TAKE 1 TABLET (50 MG TOTAL) BY MOUTH DAILY.  Dispense: 30 tablet; Refill: 5  2. Pure hypercholesterolemia - atorvastatin (LIPITOR) 40 MG tablet; TAKE 1 TABLET (40 MG TOTAL) BY MOUTH DAILY. IN THE EVENING TO REDUCE CHOLESTEROL  Dispense: 30 tablet; Refill: 5  3. Adenomatous polyp of colon, unspecified part of colon Plan for repeat colonoscopy in 3 years.   Patient was given the opportunity to ask questions.  Patient verbalized understanding of the plan and was able to repeat key elements of the plan.   No orders of the defined types were placed in this encounter.    Requested Prescriptions   Signed Prescriptions Disp Refills   amLODipine (NORVASC) 5 MG tablet 30 tablet 5    Sig: Take 1 tablet (5 mg total) by mouth daily.   losartan (COZAAR) 50 MG tablet 30 tablet 5    Sig: TAKE 1 TABLET (50 MG TOTAL) BY MOUTH DAILY.  atorvastatin (LIPITOR) 40 MG tablet 30 tablet 5    Sig:  TAKE 1 TABLET (40 MG TOTAL) BY MOUTH DAILY. IN THE EVENING TO REDUCE CHOLESTEROL    Return in about 4 months (around 11/17/2021).  Karle Plumber, MD, FACP

## 2021-09-17 ENCOUNTER — Other Ambulatory Visit: Payer: Self-pay

## 2021-11-19 ENCOUNTER — Other Ambulatory Visit: Payer: Self-pay

## 2021-11-22 ENCOUNTER — Other Ambulatory Visit: Payer: Self-pay

## 2021-11-22 ENCOUNTER — Ambulatory Visit: Payer: 59 | Admitting: Internal Medicine

## 2021-11-29 ENCOUNTER — Encounter: Payer: Self-pay | Admitting: Internal Medicine

## 2021-11-29 ENCOUNTER — Other Ambulatory Visit: Payer: Self-pay

## 2021-11-29 ENCOUNTER — Ambulatory Visit: Payer: Self-pay | Attending: Internal Medicine | Admitting: Internal Medicine

## 2021-11-29 VITALS — BP 159/96 | HR 59 | Wt 185.6 lb

## 2021-11-29 DIAGNOSIS — I10 Essential (primary) hypertension: Secondary | ICD-10-CM

## 2021-11-29 DIAGNOSIS — Z125 Encounter for screening for malignant neoplasm of prostate: Secondary | ICD-10-CM

## 2021-11-29 DIAGNOSIS — E7800 Pure hypercholesterolemia, unspecified: Secondary | ICD-10-CM

## 2021-11-29 MED ORDER — AMLODIPINE BESYLATE 10 MG PO TABS
10.0000 mg | ORAL_TABLET | Freq: Every day | ORAL | 5 refills | Status: DC
  Filled 2021-11-29: qty 30, 30d supply, fill #0
  Filled 2021-12-31: qty 30, 30d supply, fill #1
  Filled 2022-03-16: qty 30, 30d supply, fill #2
  Filled 2022-04-25: qty 30, 30d supply, fill #3
  Filled 2022-05-27: qty 30, 30d supply, fill #4
  Filled 2022-06-27: qty 30, 30d supply, fill #5

## 2021-11-29 NOTE — Patient Instructions (Signed)
Your blood pressure is not at goal.  I recommend increasing amlodipine from 5 mg daily to 10 mg daily.  This means you will start taking 2 of the 5 mg tablets from the current bottle that you have.  When you come back for refills to the pharmacy, they will dispense the 10 mg tablet. ?Please check your blood pressure at least once a day with goal being 130/80 or lower.  Record the readings and bring them with you on your next visit. ? ?You should take the cholesterol medicine atorvastatin daily. ?

## 2021-11-29 NOTE — Progress Notes (Signed)
? ? ?Patient ID: Arthur Jenkins, male    DOB: Feb 01, 1961  MRN: [id number] ? ?CC: Hypertension ? ? ?Subjective: ?Arthur Jenkins is a 61 y.o. male who presents for chronic disease management ?His concerns today include:  ?Patient with history of HL, HTN, colon polyps ? ?HYPERTENSION ?Currently taking: see medication list ?Med Adherence: '[x]'$  Yes  -Norvasc 5 and Cozaar 50 mg daily.  Took meds already for today ?Medication side effects: '[]'$  Yes    '[x]'$  No ?Adherence with salt restriction: '[x]'$  Yes    '[]'$  No ?Home Monitoring?:  Yes    '[]'$  No ?Monitoring Frequency'[]'$ :  few times a mth ?Home BP results range: 150/80 3 days ago ?SOB? '[]'$  Yes    '[x]'$  No ?Chest Pain?: '[]'$  Yes    '[x]'$  No ?Leg swelling?: '[]'$  Yes    '[x]'$  No ?Headaches?: '[]'$  Yes    '[x]'$  No ?Dizziness? '[]'$  Yes    '[x]'$  No ?Comments:  ? ?HL:  taking and tolerating Lipitor 40 mg but not every day.  Reports taking about 3 x a wk.  States he only takes it if he feels he ate something high in saturated fat or fried foods.  However, does mainly bake or grilled meats.  Walks daily for exercise and rides stationary bike 4 days a wk for 30 mins ? ?HM: Due for prostate cancer screening.  Patient is agreeable for screening. ?Patient Active Problem List  ? Diagnosis Date Noted  ? Adenomatous polyp of colon 07/19/2021  ? Positive FIT (fecal immunochemical test) 12/17/2020  ? Essential hypertension 05/30/2018  ? Hyperlipidemia 05/30/2018  ? History of shingles 05/30/2018  ?  ? ?Current Outpatient Medications on File Prior to Visit  ?Medication Sig Dispense Refill  ? atorvastatin (LIPITOR) 40 MG tablet TAKE 1 TABLET (40 MG TOTAL) BY MOUTH DAILY. IN THE EVENING TO REDUCE CHOLESTEROL 30 tablet 5  ? losartan (COZAAR) 50 MG tablet TAKE 1 TABLET (50 MG TOTAL) BY MOUTH DAILY. 30 tablet 5  ? ?No current facility-administered medications on file prior to visit.  ? ? ?No Known Allergies ? ?Social History  ? ?Socioeconomic History  ? Marital status: Single  ?  Spouse name: Not on file  ? Number of  children: Not on file  ? Years of education: Not on file  ? Highest education level: Not on file  ?Occupational History  ? Not on file  ?Tobacco Use  ? Smoking status: Never  ?  Passive exposure: Past (roommate for 1 1/2 years)  ? Smokeless tobacco: Never  ?Vaping Use  ? Vaping Use: Never used  ?Substance and Sexual Activity  ? Alcohol use: Yes  ?  Alcohol/week: 3.0 - 4.0 standard drinks  ?  Types: 3 - 4 Cans of beer per week  ?  Comment: a night after work and socially on weekends  ? Drug use: Yes  ?  Types: Marijuana  ?  Comment: smoking  ? Sexual activity: Yes  ?Other Topics Concern  ? Not on file  ?Social History Narrative  ? Not on file  ? ?Social Determinants of Health  ? ?Financial Resource Strain: Not on file  ?Food Insecurity: Not on file  ?Transportation Needs: Not on file  ?Physical Activity: Not on file  ?Stress: Not on file  ?Social Connections: Not on file  ?Intimate Partner Violence: Not on file  ? ? ?Family History  ?Problem Relation Age of Onset  ? Pancreatic cancer Mother   ? Hypertension Neg Hx   ? Diabetes Neg Hx   ?  CAD Neg Hx   ? CVA Neg Hx   ? Colon cancer Neg Hx   ? Colon polyps Neg Hx   ? Esophageal cancer Neg Hx   ? Rectal cancer Neg Hx   ? Stomach cancer Neg Hx   ? ? ?Past Surgical History:  ?Procedure Laterality Date  ? HERNIA REPAIR  1999  ? abdominal  ? ? ?ROS: ?Review of Systems ?Negative except as stated above ? ?PHYSICAL EXAM: ?BP (!) 159/96   Pulse (!) 59   Wt 185 lb 9.6 oz (84.2 kg)   SpO2 99%   BMI 25.89 kg/m?   ?Physical Exam ? ?General appearance - alert, well appearing, middle-aged African-American male and in no distress ?Mental status - normal mood, behavior, speech, dress, motor activity, and thought processes ?Neck - supple, no significant adenopathy ?Chest - clear to auscultation, no wheezes, rales or rhonchi, symmetric air entry ?Heart - normal rate, regular rhythm, normal S1, S2, no murmurs, rubs, clicks or gallops ?Extremities - peripheral pulses normal, no pedal  edema, no clubbing or cyanosis ? ? ? ?  Latest Ref Rng & Units 12/15/2020  ?  9:20 AM 01/09/2020  ?  8:55 AM 01/03/2019  ?  8:49 AM  ?CMP  ?Glucose 65 - 99 mg/dL 90   88   92    ?BUN 6 - 24 mg/dL '15   10   12    '$ ?Creatinine 0.76 - 1.27 mg/dL 0.95   0.94   0.88    ?Sodium 134 - 144 mmol/L 137   137   138    ?Potassium 3.5 - 5.2 mmol/L 4.3   4.2   4.4    ?Chloride 96 - 106 mmol/L 99   100   102    ?CO2 20 - 29 mmol/L '25   22   23    '$ ?Calcium 8.7 - 10.2 mg/dL 10.0   9.8   9.9    ?Total Protein 6.0 - 8.5 g/dL 7.7   7.6   7.2    ?Total Bilirubin 0.0 - 1.2 mg/dL 0.3   <0.2   <0.2    ?Alkaline Phos 44 - 121 IU/L 87   78   74    ?AST 0 - 40 IU/L 26   24   32    ?ALT 0 - 44 IU/L '17   15   26    '$ ? ?Lipid Panel  ?   ?Component Value Date/Time  ? CHOL 193 12/15/2020 0920  ? TRIG 43 12/15/2020 0920  ? HDL 63 12/15/2020 0920  ? CHOLHDL 3.1 12/15/2020 0920  ? LDLCALC 122 (H) 12/15/2020 0920  ? ? ?CBC ?   ?Component Value Date/Time  ? WBC 4.4 12/15/2020 0920  ? RBC 4.56 12/15/2020 0920  ? HGB 13.7 12/15/2020 0920  ? HCT 41.2 12/15/2020 0920  ? PLT 230 12/15/2020 0920  ? MCV 90 12/15/2020 0920  ? MCH 30.0 12/15/2020 0920  ? MCHC 33.3 12/15/2020 0920  ? RDW 12.7 12/15/2020 0920  ? ? ?ASSESSMENT AND PLAN: ?1. Essential hypertension ?Not at goal which is 130/80 or lower.  He reports compliance with taking Norvasc and Cozaar.  I recommend increasing the dose of Cozaar to 10 mg daily.  Advised to check blood pressure at least once a week and record the readings.  He will follow-up with clinical pharmacist in 1 month for recheck. ?- CBC ?- Comprehensive metabolic panel ?- amLODipine (NORVASC) 10 MG tablet; Take 1 tablet (10 mg total)  by mouth daily.  Dispense: 30 tablet; Refill: 5 ? ?2. Pure hypercholesterolemia ?Discussed on encourage healthy eating habits. ?Encouraged him to continue regular exercise. ?Advised to take the atorvastatin daily as prescribed. ?- Lipid panel ? ?3. Screening for prostate cancer ?Discussed prostate cancer  screening using PSA.  Patient is agreeable to screening. ?- PSA ? ? ? ?Patient was given the opportunity to ask questions.  Patient verbalized understanding of the plan and was able to repeat key elements of the plan.  ? ?This documentation was completed using Radio producer.  Any transcriptional errors are unintentional. ? ?Orders Placed This Encounter  ?Procedures  ? CBC  ? Comprehensive metabolic panel  ? Lipid panel  ? PSA  ? ? ? ?Requested Prescriptions  ? ?Signed Prescriptions Disp Refills  ? amLODipine (NORVASC) 10 MG tablet 30 tablet 5  ?  Sig: Take 1 tablet (10 mg total) by mouth daily.  ? ? ?Return in about 4 months (around 04/01/2022) for Appt with [facility] in 4 wks for BP check. ? ?Karle Plumber, MD, FACP ?

## 2021-11-30 LAB — CBC
Hematocrit: 39.6 % (ref 37.5–51.0)
Hemoglobin: 13.5 g/dL (ref 13.0–17.7)
MCH: 30.2 pg (ref 26.6–33.0)
MCHC: 34.1 g/dL (ref 31.5–35.7)
MCV: 89 fL (ref 79–97)
Platelets: 236 10*3/uL (ref 150–450)
RBC: 4.47 x10E6/uL (ref 4.14–5.80)
RDW: 12.7 % (ref 11.6–15.4)
WBC: 4.6 10*3/uL (ref 3.4–10.8)

## 2021-11-30 LAB — COMPREHENSIVE METABOLIC PANEL
ALT: 14 IU/L (ref 0–44)
AST: 23 IU/L (ref 0–40)
Albumin/Globulin Ratio: 1.6 (ref 1.2–2.2)
Albumin: 4.5 g/dL (ref 3.8–4.9)
Alkaline Phosphatase: 85 IU/L (ref 44–121)
BUN/Creatinine Ratio: 13 (ref 10–24)
BUN: 11 mg/dL (ref 8–27)
Bilirubin Total: 0.2 mg/dL (ref 0.0–1.2)
CO2: 24 mmol/L (ref 20–29)
Calcium: 10 mg/dL (ref 8.6–10.2)
Chloride: 102 mmol/L (ref 96–106)
Creatinine, Ser: 0.85 mg/dL (ref 0.76–1.27)
Globulin, Total: 2.9 g/dL (ref 1.5–4.5)
Glucose: 91 mg/dL (ref 70–99)
Potassium: 3.9 mmol/L (ref 3.5–5.2)
Sodium: 141 mmol/L (ref 134–144)
Total Protein: 7.4 g/dL (ref 6.0–8.5)
eGFR: 99 mL/min/{1.73_m2} (ref >59)

## 2021-11-30 LAB — LIPID PANEL
Chol/HDL Ratio: 3.1 ratio (ref 0.0–5.0)
Cholesterol, Total: 196 mg/dL (ref 100–199)
HDL: 63 mg/dL (ref >39)
LDL Chol Calc (NIH): 124 mg/dL — ABNORMAL HIGH (ref 0–99)
Triglycerides: 50 mg/dL (ref 0–149)
VLDL Cholesterol Cal: 9 mg/dL (ref 5–40)

## 2021-11-30 LAB — PSA: Prostate Specific Ag, Serum: 0.7 ng/mL (ref 0.0–4.0)

## 2021-11-30 NOTE — Progress Notes (Signed)
Let patient know that his cholesterol level is elevated at 124 with goal being less than 100.  Please make sure that he is taking the atorvastatin consistently to help lower his cholesterol.  Blood cell counts are normal.  Kidney and liver function test normal.  PSA which is a screening test for prostate cancer is normal.

## 2021-12-02 ENCOUNTER — Telehealth: Payer: Self-pay

## 2021-12-02 NOTE — Telephone Encounter (Signed)
Pt returned call to go over lab results provided pt with results and pt doesn't have any questions or concerns  ?

## 2021-12-03 ENCOUNTER — Other Ambulatory Visit: Payer: Self-pay

## 2021-12-22 ENCOUNTER — Other Ambulatory Visit: Payer: Self-pay | Admitting: Pharmacist

## 2021-12-22 NOTE — Chronic Care Management (AMB) (Signed)
Patient  outreached by Sinda Du, PharmD Candidate on 12/22/21 to discuss hypertension.   Patient has an automated home blood pressure machine. They report home readings 130s/80s prior to taking medications  Medication review was performed. They are taking medications as prescribed.   The following barriers to adherence were noted: - Denies concerns with medication access or understanding.  The following interventions were completed:  - Medications were reviewed - Patient was educated on medications, including indication and administration - Patient was counseled on lifestyle modifications to improve blood pressure - praised for exercise, working out 4x per week and increasing to 45 minutes each time. Diet has been good, concentrating on avoiding salt by reading food labels and seasoning food less. Grilling more.   The patient has follow up scheduled:  Embedded Pharmacist - 12/30/21 PCP: 04/01/22   Catie Hedwig Morton, PharmD, [facility] [phone]

## 2021-12-30 ENCOUNTER — Ambulatory Visit: Payer: Self-pay | Admitting: Pharmacist

## 2021-12-30 NOTE — Progress Notes (Deleted)
   S:     No chief complaint on file.   Jahshua Bonito is a 61 y.o. male who presents for hypertension evaluation, education, and management. PMH is significant for ***. Patient was referred and last seen by Primary Care Provider, Dr. ***, on ***. Patient was referred on ***. Patient was last seen by Primary Care Provider on ***. At last visit, ***.   Today, patient arrives in *** spirits and presents {w-w/o:315700} assistance. *** Denies dizziness, headache, blurred vision, swelling.   Patient reports hypertension was diagnosed in ***.   Family/Social history: ***  Medication adherence *** . Patient has *** taken BP medications today.   Current antihypertensives include: ***  Antihypertensives tried in the past include: ***  Reported home BP readings: ***  Patient reported dietary habits: Eats *** meals/day Breakfast: *** Lunch: *** Dinner: *** Snacks: *** Drinks: ***  Patient-reported exercise habits: ***  ASCVD risk factors include: ***  PHQ-9 Score: ***  O:  Physical Exam  ROS  Last 3 Office BP readings: BP Readings from Last 3 Encounters:  12/22/21 135/86  11/29/21 (!) 159/96  07/19/21 (!) 152/85    BMET    Component Value Date/Time   NA 141 11/29/2021 0933   K 3.9 11/29/2021 0933   CL 102 11/29/2021 0933   CO2 24 11/29/2021 0933   GLUCOSE 91 11/29/2021 0933   BUN 11 11/29/2021 0933   CREATININE 0.85 11/29/2021 0933   CALCIUM 10.0 11/29/2021 0933   GFRNONAA 89 01/09/2020 0855   GFRAA 103 01/09/2020 0855    Renal function: CrCl cannot be calculated (Patient's most recent lab result is older than the maximum 21 days allowed.).  Clinical ASCVD: No  The 10-year ASCVD risk score (Arnett DK, et al., 2019) is: 13.7%   Values used to calculate the score:     Age: 60 years     Sex: Male     Is Non-Hispanic African American: Yes     Diabetic: No     Tobacco smoker: No     Systolic Blood Pressure: 335 mmHg     Is BP treated: Yes     HDL  Cholesterol: 63 mg/dL     Total Cholesterol: 196 mg/dL  A/P: Hypertension diagnosed *** currently *** on current medications. BP goal < 130/80 *** mmHg. Medication adherence appears ***. Control is suboptimal due to ***.  -{Meds adjust:18428} ***.  -Patient educated on purpose, proper use, and potential adverse effects of ***.  -F/u labs ordered - *** -Counseled on lifestyle modifications for blood pressure control including reduced dietary sodium, increased exercise, adequate sleep. -Encouraged patient to check BP at home and bring log of readings to next visit. Counseled on proper use of home BP cuff.   Results reviewed and written information provided. Patient verbalized understanding of treatment plan. Total time in face-to-face counseling *** minutes.   F/u clinic visit in ***. Patient seen with ***.

## 2021-12-31 ENCOUNTER — Other Ambulatory Visit: Payer: Self-pay

## 2022-01-06 ENCOUNTER — Other Ambulatory Visit: Payer: Self-pay

## 2022-02-07 ENCOUNTER — Ambulatory Visit: Payer: Self-pay | Admitting: Pharmacist

## 2022-03-16 ENCOUNTER — Other Ambulatory Visit: Payer: Self-pay

## 2022-03-17 ENCOUNTER — Other Ambulatory Visit: Payer: Self-pay

## 2022-04-01 ENCOUNTER — Ambulatory Visit: Payer: Self-pay | Admitting: Internal Medicine

## 2022-04-25 ENCOUNTER — Other Ambulatory Visit: Payer: Self-pay

## 2022-05-27 ENCOUNTER — Other Ambulatory Visit: Payer: Self-pay

## 2022-06-27 ENCOUNTER — Other Ambulatory Visit: Payer: Self-pay

## 2022-06-27 ENCOUNTER — Other Ambulatory Visit: Payer: Self-pay | Admitting: Internal Medicine

## 2022-06-27 DIAGNOSIS — I10 Essential (primary) hypertension: Secondary | ICD-10-CM

## 2022-06-27 MED ORDER — LOSARTAN POTASSIUM 50 MG PO TABS
50.0000 mg | ORAL_TABLET | Freq: Every day | ORAL | 0 refills | Status: DC
  Filled 2022-06-27: qty 30, 30d supply, fill #0

## 2022-07-28 ENCOUNTER — Other Ambulatory Visit: Payer: Self-pay | Admitting: Internal Medicine

## 2022-07-28 ENCOUNTER — Other Ambulatory Visit: Payer: Self-pay

## 2022-07-28 DIAGNOSIS — I10 Essential (primary) hypertension: Secondary | ICD-10-CM

## 2022-07-29 ENCOUNTER — Other Ambulatory Visit: Payer: Self-pay

## 2022-08-03 ENCOUNTER — Other Ambulatory Visit: Payer: Self-pay

## 2022-08-09 ENCOUNTER — Other Ambulatory Visit: Payer: Self-pay

## 2022-08-09 ENCOUNTER — Ambulatory Visit: Payer: Self-pay | Attending: Internal Medicine | Admitting: Internal Medicine

## 2022-08-09 VITALS — BP 131/69 | HR 76 | Temp 97.9°F | Ht 71.0 in | Wt 199.0 lb

## 2022-08-09 DIAGNOSIS — E663 Overweight: Secondary | ICD-10-CM

## 2022-08-09 DIAGNOSIS — Z1211 Encounter for screening for malignant neoplasm of colon: Secondary | ICD-10-CM

## 2022-08-09 DIAGNOSIS — E7800 Pure hypercholesterolemia, unspecified: Secondary | ICD-10-CM

## 2022-08-09 DIAGNOSIS — I10 Essential (primary) hypertension: Secondary | ICD-10-CM

## 2022-08-09 MED ORDER — ATORVASTATIN CALCIUM 40 MG PO TABS
40.0000 mg | ORAL_TABLET | Freq: Every evening | ORAL | 5 refills | Status: DC
  Filled 2022-08-09: qty 30, 30d supply, fill #0
  Filled 2022-09-09 (×2): qty 30, 30d supply, fill #1
  Filled 2022-10-07: qty 30, 30d supply, fill #2
  Filled 2022-11-08: qty 30, 30d supply, fill #3
  Filled 2022-12-16: qty 30, 30d supply, fill #4
  Filled 2023-01-16: qty 30, 30d supply, fill #5

## 2022-08-09 MED ORDER — AMLODIPINE BESYLATE 10 MG PO TABS
10.0000 mg | ORAL_TABLET | Freq: Every day | ORAL | 5 refills | Status: DC
  Filled 2022-08-09: qty 30, 30d supply, fill #0
  Filled 2022-09-09 (×2): qty 30, 30d supply, fill #1
  Filled 2022-10-07: qty 30, 30d supply, fill #2
  Filled 2022-11-08: qty 30, 30d supply, fill #3
  Filled 2022-12-16: qty 30, 30d supply, fill #4
  Filled 2023-01-16: qty 30, 30d supply, fill #5

## 2022-08-09 MED ORDER — LOSARTAN POTASSIUM 50 MG PO TABS
50.0000 mg | ORAL_TABLET | Freq: Every day | ORAL | 5 refills | Status: DC
  Filled 2022-08-09: qty 30, 30d supply, fill #0
  Filled 2022-09-09 (×2): qty 30, 30d supply, fill #1
  Filled 2022-10-07: qty 30, 30d supply, fill #2
  Filled 2022-11-08: qty 30, 30d supply, fill #3
  Filled 2022-12-16: qty 30, 30d supply, fill #4
  Filled 2023-01-16: qty 30, 30d supply, fill #5

## 2022-08-09 NOTE — Progress Notes (Signed)
Patient ID: [name], male    DOB: [birth year]  MRN: [id number]  CC: Hypertension (Htn f/u.  Med refill. /No to flu vax)   Subjective: Arthur Jenkins is a 62 y.o. male who presents for chronic ds management.  Last seen 11/2021 His concerns today include:  Patient with history of HL, HTN, colon polyps   Patient with history of HTN.   He is suppose be on Norvasc 5 mg daily and Cozaar 50 mg daily. Out x 2 wks Limit salt in the foods. Checks blood pressure every other day.  Reports last 130/80 No CP/SOB  Taking and tolerating atorvastatin 40 mg daily for cholesterol. Still has about 6 pills left.   Wgh up 14 lbs since last visit 11/2021. Attributes wgh gain to dietary indiscretions during recent holidays.  Does not eat red meat. Drinks mainly water.  Gets in fruits and veggies daily Has been laying around.  Plans to start exercising again.  Has a stationary bike which he will use daily for 30 mins.  Wearing several layers of clothing today.   HM:  had updated COVID booster at [facility] about 2 mths ago.  Patient Active Problem List   Diagnosis Date Noted   Adenomatous polyp of colon 07/19/2021   Positive FIT (fecal immunochemical test) 12/17/2020   Essential hypertension 05/30/2018   Hyperlipidemia 05/30/2018   History of shingles 05/30/2018     Current Outpatient Medications on File Prior to Visit  Medication Sig Dispense Refill   amLODipine (NORVASC) 10 MG tablet Take 1 tablet (10 mg total) by mouth daily. 30 tablet 5   losartan (COZAAR) 50 MG tablet Take 1 tablet (50 mg total) by mouth daily. 30 tablet 0   atorvastatin (LIPITOR) 40 MG tablet TAKE 1 TABLET (40 MG TOTAL) BY MOUTH DAILY. IN THE EVENING TO REDUCE CHOLESTEROL 30 tablet 5   No current facility-administered medications on file prior to visit.    No Known Allergies  Social History   Socioeconomic History   Marital status: Single    Spouse name: Not on file   Number of children: Not on file    Years of education: Not on file   Highest education level: Not on file  Occupational History   Not on file  Tobacco Use   Smoking status: Never    Passive exposure: Past (roommate for 1 1/2 years)   Smokeless tobacco: Never  Vaping Use   Vaping Use: Never used  Substance and Sexual Activity   Alcohol use: Yes    Alcohol/week: 3.0 - 4.0 standard drinks of alcohol    Types: 3 - 4 Cans of beer per week    Comment: a night after work and socially on weekends   Drug use: Yes    Types: Marijuana    Comment: smoking   Sexual activity: Yes  Other Topics Concern   Not on file  Social History Narrative   Not on file   Social Determinants of Health   Financial Resource Strain: Not on file  Food Insecurity: Not on file  Transportation Needs: Not on file  Physical Activity: Not on file  Stress: Not on file  Social Connections: Not on file  Intimate Partner Violence: Not on file    Family History  Problem Relation Age of Onset   Pancreatic cancer Mother    Hypertension Neg Hx    Diabetes Neg Hx    CAD Neg Hx    CVA Neg Hx    Colon cancer  Neg Hx    Colon polyps Neg Hx    Esophageal cancer Neg Hx    Rectal cancer Neg Hx    Stomach cancer Neg Hx     Past Surgical History:  Procedure Laterality Date   HERNIA REPAIR  1999   abdominal    ROS: Review of Systems Negative except as stated above  PHYSICAL EXAM: BP 131/69 (BP Location: Left Arm, Patient Position: Sitting, Cuff Size: Normal)   Pulse 76   Temp 97.9 F (36.6 C) (Oral)   Ht '5\' 11"'$  (1.803 m)   Wt 199 lb (90.3 kg)   SpO2 97%   BMI 27.75 kg/m   Wt Readings from Last 3 Encounters:  08/09/22 199 lb (90.3 kg)  11/29/21 185 lb 9.6 oz (84.2 kg)  07/19/21 182 lb (82.6 kg)    Physical Exam  General appearance - alert, well appearing, and in no distress Mental status - normal mood, behavior, speech, dress, motor activity, and thought processes Chest - clear to auscultation, no wheezes, rales or rhonchi,  symmetric air entry Heart - normal rate, regular rhythm, normal S1, S2, no murmurs, rubs, clicks or gallops Extremities - peripheral pulses normal, no pedal edema, no clubbing or cyanosis      Latest Ref Rng & Units 11/29/2021    9:33 AM 12/15/2020    9:20 AM 01/09/2020    8:55 AM  CMP  Glucose 70 - 99 mg/dL 91  90  88   BUN 8 - 27 mg/dL '11  15  10   '$ Creatinine 0.76 - 1.27 mg/dL 0.85  0.95  0.94   Sodium 134 - 144 mmol/L 141  137  137   Potassium 3.5 - 5.2 mmol/L 3.9  4.3  4.2   Chloride 96 - 106 mmol/L 102  99  100   CO2 20 - 29 mmol/L '24  25  22   '$ Calcium 8.6 - 10.2 mg/dL 10.0  10.0  9.8   Total Protein 6.0 - 8.5 g/dL 7.4  7.7  7.6   Total Bilirubin 0.0 - 1.2 mg/dL 0.2  0.3  <0.2   Alkaline Phos 44 - 121 IU/L 85  87  78   AST 0 - 40 IU/L '23  26  24   '$ ALT 0 - 44 IU/L '14  17  15    '$ Lipid Panel     Component Value Date/Time   CHOL 196 11/29/2021 0933   TRIG 50 11/29/2021 0933   HDL 63 11/29/2021 0933   CHOLHDL 3.1 11/29/2021 0933   LDLCALC 124 (H) 11/29/2021 0933    CBC    Component Value Date/Time   WBC 4.6 11/29/2021 0933   RBC 4.47 11/29/2021 0933   HGB 13.5 11/29/2021 0933   HCT 39.6 11/29/2021 0933   PLT 236 11/29/2021 0933   MCV 89 11/29/2021 0933   MCH 30.2 11/29/2021 0933   MCHC 34.1 11/29/2021 0933   RDW 12.7 11/29/2021 0933    ASSESSMENT AND PLAN: 1. Essential hypertension Continue amlodipine and Cozaar.  Refills given. - amLODipine (NORVASC) 10 MG tablet; Take 1 tablet (10 mg total) by mouth daily.  Dispense: 30 tablet; Refill: 5 - losartan (COZAAR) 50 MG tablet; Take 1 tablet (50 mg total) by mouth daily.  Dispense: 30 tablet; Refill: 5  2. Pure hypercholesterolemia - atorvastatin (LIPITOR) 40 MG tablet; TAKE 1 TABLET (40 MG TOTAL) BY MOUTH DAILY. IN THE EVENING TO REDUCE CHOLESTEROL  Dispense: 30 tablet; Refill: 5  3. Overweight (BMI 25.0-29.9) Patient  advised to eliminate sugary drinks from the diet, cut back on portion sizes especially of white  carbohydrates, eat more white lean meat like chicken [place] and seafood instead of beef or pork and incorporate fresh fruits and vegetables into the diet daily.    Patient was given the opportunity to ask questions.  Patient verbalized understanding of the plan and was able to repeat key elements of the plan.   This documentation was completed using Radio producer.  Any transcriptional errors are unintentional.  No orders of the defined types were placed in this encounter.    Requested Prescriptions    No prescriptions requested or ordered in this encounter    No follow-ups on file.  Karle Plumber, MD, FACP

## 2022-08-10 ENCOUNTER — Other Ambulatory Visit: Payer: Self-pay

## 2022-08-11 ENCOUNTER — Other Ambulatory Visit: Payer: Self-pay

## 2022-09-09 ENCOUNTER — Other Ambulatory Visit: Payer: Self-pay

## 2022-10-07 ENCOUNTER — Other Ambulatory Visit: Payer: Self-pay

## 2022-11-08 ENCOUNTER — Other Ambulatory Visit: Payer: Self-pay

## 2022-11-09 ENCOUNTER — Other Ambulatory Visit: Payer: Self-pay

## 2022-12-09 ENCOUNTER — Encounter: Payer: Self-pay | Admitting: Internal Medicine

## 2022-12-09 ENCOUNTER — Ambulatory Visit: Payer: Self-pay | Attending: Internal Medicine | Admitting: Internal Medicine

## 2022-12-09 VITALS — BP 130/74 | HR 64 | Temp 98.3°F | Ht 71.0 in | Wt 190.0 lb

## 2022-12-09 DIAGNOSIS — Z125 Encounter for screening for malignant neoplasm of prostate: Secondary | ICD-10-CM

## 2022-12-09 DIAGNOSIS — E663 Overweight: Secondary | ICD-10-CM

## 2022-12-09 DIAGNOSIS — I10 Essential (primary) hypertension: Secondary | ICD-10-CM

## 2022-12-09 DIAGNOSIS — E7800 Pure hypercholesterolemia, unspecified: Secondary | ICD-10-CM

## 2022-12-09 DIAGNOSIS — D649 Anemia, unspecified: Secondary | ICD-10-CM

## 2022-12-09 NOTE — Progress Notes (Signed)
Patient ID: Arthur Jenkins, male    DOB: [birth year]  MRN: [id number]  CC: Hypertension (HTN f/u. /No questions / concerns)   Subjective: Arthur Jenkins is a 62 y.o. male who presents for chronic ds management.  Of note patient was an hour and a half late for this appointment. His concerns today include:  Patient with history of HL, HTN, colon polyps     HYPERTENSION Currently taking: see medication list.  Took meds already this a.m.  He is on Norvasc 10 mg daily and Cozaar 50 mg daily. Med Adherence: [x]  Yes    []  No Medication side effects: []  Yes    [x]  No Adherence with salt restriction: [x]  Yes but can do better   []  No Home Monitoring?: []  Yes    [x]  No Monitoring Frequency:  Home BP results range:  SOB? []  Yes    [x]  No Chest Pain?: []  Yes    [x]  No Leg swelling?: []  Yes    [x]  No Headaches?: []  Yes    [x]  No Dizziness? []  Yes    [x]  No Comments: He is due for blood test today including cholesterol, kidney and liver function test.  Agreeable to PSA level for prostate cancer screening.  HL:  reports compliance with and tolerating Lipitor 40 mg daily.  Last LDL 1 year ago was 124.  Walking 3 days a wk for 45 min.  Wgh down 9 lbs since last visit 4 mths ago.  Feels he can improve on eating habits.  Eat mainly white meat.  Drinks mainly water. Patient Active Problem List   Diagnosis Date Noted   Adenomatous polyp of colon 07/19/2021   Positive FIT (fecal immunochemical test) 12/17/2020   Essential hypertension 05/30/2018   Hyperlipidemia 05/30/2018   History of shingles 05/30/2018     Current Outpatient Medications on File Prior to Visit  Medication Sig Dispense Refill   amLODipine (NORVASC) 10 MG tablet Take 1 tablet (10 mg total) by mouth daily. 30 tablet 5   atorvastatin (LIPITOR) 40 MG tablet TAKE 1 TABLET (40 MG TOTAL) BY MOUTH DAILY. IN THE EVENING TO REDUCE CHOLESTEROL 30 tablet 5   losartan (COZAAR) 50 MG tablet Take 1 tablet (50 mg total) by mouth  daily. 30 tablet 5   No current facility-administered medications on file prior to visit.    No Known Allergies  Social History   Socioeconomic History   Marital status: Single    Spouse name: Not on file   Number of children: Not on file   Years of education: Not on file   Highest education level: Not on file  Occupational History   Not on file  Tobacco Use   Smoking status: Never    Passive exposure: Past (roommate for 1 1/2 years)   Smokeless tobacco: Never  Vaping Use   Vaping Use: Never used  Substance and Sexual Activity   Alcohol use: Yes    Alcohol/week: 3.0 - 4.0 standard drinks of alcohol    Types: 3 - 4 Cans of beer per week    Comment: a night after work and socially on weekends   Drug use: Yes    Types: Marijuana    Comment: smoking   Sexual activity: Yes  Other Topics Concern   Not on file  Social History Narrative   Not on file   Social Determinants of Health   Financial Resource Strain: Not on file  Food Insecurity: Not on file  Transportation Needs: Not on file  Physical Activity: Not on file  Stress: Not on file  Social Connections: Not on file  Intimate Partner Violence: Not on file    Family History  Problem Relation Age of Onset   Pancreatic cancer Mother    Hypertension Neg Hx    Diabetes Neg Hx    CAD Neg Hx    CVA Neg Hx    Colon cancer Neg Hx    Colon polyps Neg Hx    Esophageal cancer Neg Hx    Rectal cancer Neg Hx    Stomach cancer Neg Hx     Past Surgical History:  Procedure Laterality Date   HERNIA REPAIR  1999   abdominal    ROS: Review of Systems Negative except as stated above  PHYSICAL EXAM: BP 130/74   Pulse 64   Temp 98.3 F (36.8 C) (Oral)   Ht 5\' 11"  (1.803 m)   Wt 190 lb (86.2 kg)   SpO2 99%   BMI 26.50 kg/m   Wt Readings from Last 3 Encounters:  12/09/22 190 lb (86.2 kg)  08/09/22 199 lb (90.3 kg)  11/29/21 185 lb 9.6 oz (84.2 kg)    Physical Exam   General appearance - alert, well  appearing, older African-American male and in no distress Mental status - normal mood, behavior, speech, dress, motor activity, and thought processes Neck - supple, no significant adenopathy Chest - clear to auscultation, no wheezes, rales or rhonchi, symmetric air entry Heart - normal rate, regular rhythm, normal S1, S2, no murmurs, rubs, clicks or gallops Extremities - peripheral pulses normal, no pedal edema, no clubbing or cyanosis     Latest Ref Rng & Units 11/29/2021    9:33 AM 12/15/2020    9:20 AM 01/09/2020    8:55 AM  CMP  Glucose 70 - 99 mg/dL 91  90  88   BUN 8 - 27 mg/dL 11  15  10    Creatinine 0.76 - 1.27 mg/dL 8.11  9.14  7.82   Sodium 134 - 144 mmol/L 141  137  137   Potassium 3.5 - 5.2 mmol/L 3.9  4.3  4.2   Chloride 96 - 106 mmol/L 102  99  100   CO2 20 - 29 mmol/L 24  25  22    Calcium 8.6 - 10.2 mg/dL 95.6  21.3  9.8   Total Protein 6.0 - 8.5 g/dL 7.4  7.7  7.6   Total Bilirubin 0.0 - 1.2 mg/dL 0.2  0.3  <0.8   Alkaline Phos 44 - 121 IU/L 85  87  78   AST 0 - 40 IU/L 23  26  24    ALT 0 - 44 IU/L 14  17  15     Lipid Panel     Component Value Date/Time   CHOL 196 11/29/2021 0933   TRIG 50 11/29/2021 0933   HDL 63 11/29/2021 0933   CHOLHDL 3.1 11/29/2021 0933   LDLCALC 124 (H) 11/29/2021 0933    CBC    Component Value Date/Time   WBC 4.6 11/29/2021 0933   RBC 4.47 11/29/2021 0933   HGB 13.5 11/29/2021 0933   HCT 39.6 11/29/2021 0933   PLT 236 11/29/2021 0933   MCV 89 11/29/2021 0933   MCH 30.2 11/29/2021 0933   MCHC 34.1 11/29/2021 0933   RDW 12.7 11/29/2021 0933    ASSESSMENT AND PLAN:  1. Essential hypertension At goal.  Continue Norvasc 10 mg daily and Cozaar 50 mg daily. - CBC - Comprehensive metabolic  panel  2. Pure hypercholesterolemia Due for recheck of cholesterol level.  Continue atorvastatin 40 mg daily. - Lipid panel  3. Screening for prostate cancer Patient agreeable to prostate cancer screening with PSA level check. - PSA  4.  Overweight (BMI 25.0-29.9) Patient advised to eliminate sugary drinks from the diet, cut back on portion sizes especially of white carbohydrates, eat more white lean meat like chicken [place] and seafood instead of beef or pork and incorporate fresh fruits and vegetables into the diet daily. Continue regular exercise.  Patient is uninsured.  Encouraged him to apply for Medicaid as he most likely would qualify.  Informed him of how to do that.  Patient was given the opportunity to ask questions.  Patient verbalized understanding of the plan and was able to repeat key elements of the plan.   This documentation was completed using Paediatric nurse.  Any transcriptional errors are unintentional.  Orders Placed This Encounter  Procedures   CBC   Comprehensive metabolic panel   Lipid panel   PSA     Requested Prescriptions    No prescriptions requested or ordered in this encounter    Return in about 4 months (around 04/11/2023).  Jonah Blue, MD, FACP

## 2022-12-10 LAB — CBC
Hematocrit: 37.4 % — ABNORMAL LOW (ref 37.5–51.0)
Hemoglobin: 12.4 g/dL — ABNORMAL LOW (ref 13.0–17.7)
MCH: 29.7 pg (ref 26.6–33.0)
MCHC: 33.2 g/dL (ref 31.5–35.7)
MCV: 90 fL (ref 79–97)
Platelets: 247 10*3/uL (ref 150–450)
RBC: 4.18 x10E6/uL (ref 4.14–5.80)
RDW: 12.9 % (ref 11.6–15.4)
WBC: 4.3 10*3/uL (ref 3.4–10.8)

## 2022-12-10 LAB — COMPREHENSIVE METABOLIC PANEL
ALT: 24 IU/L (ref 0–44)
AST: 27 IU/L (ref 0–40)
Albumin/Globulin Ratio: 1.6 (ref 1.2–2.2)
Albumin: 4.7 g/dL (ref 3.9–4.9)
Alkaline Phosphatase: 86 IU/L (ref 44–121)
BUN/Creatinine Ratio: 12 (ref 10–24)
BUN: 12 mg/dL (ref 8–27)
Bilirubin Total: 0.3 mg/dL (ref 0.0–1.2)
CO2: 25 mmol/L (ref 20–29)
Calcium: 9.9 mg/dL (ref 8.6–10.2)
Chloride: 101 mmol/L (ref 96–106)
Creatinine, Ser: 0.97 mg/dL (ref 0.76–1.27)
Globulin, Total: 2.9 g/dL (ref 1.5–4.5)
Glucose: 80 mg/dL (ref 70–99)
Potassium: 4.5 mmol/L (ref 3.5–5.2)
Sodium: 139 mmol/L (ref 134–144)
Total Protein: 7.6 g/dL (ref 6.0–8.5)
eGFR: 89 mL/min/{1.73_m2} (ref >59)

## 2022-12-10 LAB — LIPID PANEL
Chol/HDL Ratio: 2.6 ratio (ref 0.0–5.0)
Cholesterol, Total: 144 mg/dL (ref 100–199)
HDL: 55 mg/dL (ref >39)
LDL Chol Calc (NIH): 79 mg/dL (ref 0–99)
Triglycerides: 46 mg/dL (ref 0–149)
VLDL Cholesterol Cal: 10 mg/dL (ref 5–40)

## 2022-12-10 LAB — PSA: Prostate Specific Ag, Serum: 0.6 ng/mL (ref 0.0–4.0)

## 2022-12-10 NOTE — Progress Notes (Signed)
Let patient know that he has mild anemia.  I will ask the lab to check iron level on the blood that was already drawn and will let him know once we have the results.  Kidney and liver function tests are good.  Cholesterol level normal.  PSA which is a screening test for prostate cancer is normal.

## 2022-12-10 NOTE — Addendum Note (Signed)
Addended by: Jonah Blue B on: 12/10/2022 06:55 PM   Modules accepted: Orders

## 2022-12-13 LAB — IRON AND TIBC
Iron Saturation: 18 % (ref 15–55)
Iron: 59 ug/dL (ref 38–169)

## 2022-12-13 LAB — SPECIMEN STATUS REPORT

## 2022-12-16 ENCOUNTER — Other Ambulatory Visit: Payer: Self-pay

## 2022-12-29 ENCOUNTER — Telehealth: Payer: Self-pay | Admitting: Internal Medicine

## 2022-12-29 NOTE — Telephone Encounter (Signed)
Pt is calling back to get his lab results. Please advise.

## 2022-12-29 NOTE — Telephone Encounter (Signed)
Pt. Given lab results, verbalizes understanding. Follow up appointment made.

## 2022-12-29 NOTE — Telephone Encounter (Signed)
Called patient back for lab results, no answer. Left VM to call back.

## 2023-01-03 LAB — FERRITIN: Ferritin: 151 ng/mL (ref 30–400)

## 2023-01-03 LAB — IRON AND TIBC
Total Iron Binding Capacity: 331 ug/dL (ref 250–450)
UIBC: 272 ug/dL (ref 111–343)

## 2023-01-16 ENCOUNTER — Other Ambulatory Visit: Payer: Self-pay

## 2023-02-17 ENCOUNTER — Other Ambulatory Visit (HOSPITAL_BASED_OUTPATIENT_CLINIC_OR_DEPARTMENT_OTHER): Payer: Self-pay

## 2023-02-17 ENCOUNTER — Other Ambulatory Visit: Payer: Self-pay

## 2023-02-17 ENCOUNTER — Other Ambulatory Visit: Payer: Self-pay | Admitting: Internal Medicine

## 2023-02-17 DIAGNOSIS — E7800 Pure hypercholesterolemia, unspecified: Secondary | ICD-10-CM

## 2023-02-17 DIAGNOSIS — I10 Essential (primary) hypertension: Secondary | ICD-10-CM

## 2023-02-17 MED ORDER — LOSARTAN POTASSIUM 50 MG PO TABS
50.0000 mg | ORAL_TABLET | Freq: Every day | ORAL | 0 refills | Status: DC
Start: 2023-02-17 — End: 2023-04-10
  Filled 2023-02-17 (×3): qty 90, 90d supply, fill #0

## 2023-02-17 MED ORDER — AMLODIPINE BESYLATE 10 MG PO TABS
10.0000 mg | ORAL_TABLET | Freq: Every day | ORAL | 0 refills | Status: DC
Start: 2023-02-17 — End: 2023-04-10
  Filled 2023-02-17 (×3): qty 90, 90d supply, fill #0

## 2023-02-17 MED ORDER — ATORVASTATIN CALCIUM 40 MG PO TABS
40.0000 mg | ORAL_TABLET | Freq: Every evening | ORAL | 0 refills | Status: DC
Start: 2023-02-17 — End: 2023-04-10
  Filled 2023-02-17 (×3): qty 90, 90d supply, fill #0

## 2023-04-10 ENCOUNTER — Encounter: Payer: Self-pay | Admitting: Internal Medicine

## 2023-04-10 ENCOUNTER — Other Ambulatory Visit: Payer: Self-pay

## 2023-04-10 ENCOUNTER — Ambulatory Visit: Payer: Self-pay | Attending: Internal Medicine | Admitting: Internal Medicine

## 2023-04-10 VITALS — BP 118/72 | HR 61 | Ht 71.0 in | Wt 194.0 lb

## 2023-04-10 DIAGNOSIS — Z2821 Immunization not carried out because of patient refusal: Secondary | ICD-10-CM

## 2023-04-10 DIAGNOSIS — E7800 Pure hypercholesterolemia, unspecified: Secondary | ICD-10-CM

## 2023-04-10 DIAGNOSIS — D649 Anemia, unspecified: Secondary | ICD-10-CM

## 2023-04-10 DIAGNOSIS — I10 Essential (primary) hypertension: Secondary | ICD-10-CM

## 2023-04-10 MED ORDER — LOSARTAN POTASSIUM 50 MG PO TABS
50.0000 mg | ORAL_TABLET | Freq: Every day | ORAL | 2 refills | Status: DC
Start: 2023-04-10 — End: 2024-02-27
  Filled 2023-04-10 – 2023-05-22 (×2): qty 90, 90d supply, fill #0
  Filled 2023-08-24: qty 90, 90d supply, fill #1
  Filled 2023-11-24: qty 90, 90d supply, fill #2

## 2023-04-10 MED ORDER — ATORVASTATIN CALCIUM 40 MG PO TABS
40.0000 mg | ORAL_TABLET | Freq: Every evening | ORAL | 2 refills | Status: DC
Start: 2023-04-10 — End: 2024-03-12
  Filled 2023-04-10 – 2023-05-22 (×2): qty 90, 90d supply, fill #0
  Filled 2023-08-24: qty 90, 90d supply, fill #1
  Filled 2023-11-24: qty 90, 90d supply, fill #2

## 2023-04-10 MED ORDER — AMLODIPINE BESYLATE 10 MG PO TABS
10.0000 mg | ORAL_TABLET | Freq: Every day | ORAL | 2 refills | Status: DC
Start: 2023-04-10 — End: 2024-02-27
  Filled 2023-04-10 – 2023-05-22 (×2): qty 90, 90d supply, fill #0
  Filled 2023-08-24: qty 90, 90d supply, fill #1
  Filled 2023-11-24: qty 90, 90d supply, fill #2

## 2023-04-10 NOTE — Progress Notes (Signed)
Patient ID: Arthur Jenkins, male    DOB: [birth year]  MRN: [id number]  CC: Hypertension (HTN f/u. /No questions/ concerns/No to flu vax. )   Subjective: Arthur Jenkins is a 62 y.o. male who presents for chronic ds management. His concerns today include:  Patient with history of HL, HTN, colon polyps   Currently taking: see medication list.  Took meds already this a.m.  He is on Norvasc 10 mg daily and Cozaar 50 mg daily. Med Adherence: [x]  Yes    []  No Medication side effects: []  Yes    [x]  No Adherence with salt restriction: [x]  Yes but can do better   []  No Home Monitoring?: []  Yes    [x]  No, not lately Monitoring Frequency:  Home BP results range:  SOB? []  Yes    [x]  No Chest Pain?: []  Yes    [x]  No Leg swelling?: []  Yes    [x]  No Headaches?: []  Yes    [x]  No Dizziness? []  Yes    [x]  No Comments:   Noted to have mild anemia on CBC done on his last visit.  H/H 12.4/37.  Year before it was 13.5/39.6.  Iron studies look like early iron deficiency.  We had plan to recheck CBC and iron today.  He denies any dizziness.  No blood in stools or dark stools.  Had colonoscopy 01/2021.  3 polyps removed.  HL:  on Lipitor 40 mg and taking consistently  HM: Declines flu vaccine and Tdapt.  Reports having took COVID-19 booster on 04/05/2023 at [facility].   Patient Active Problem List   Diagnosis Date Noted   Adenomatous polyp of colon 07/19/2021   Positive FIT (fecal immunochemical test) 12/17/2020   Essential hypertension 05/30/2018   Hyperlipidemia 05/30/2018   History of shingles 05/30/2018     Current Outpatient Medications on File Prior to Visit  Medication Sig Dispense Refill   amLODipine (NORVASC) 10 MG tablet Take 1 tablet (10 mg total) by mouth daily. 90 tablet 0   atorvastatin (LIPITOR) 40 MG tablet TAKE 1 TABLET (40 MG TOTAL) BY MOUTH DAILY. IN THE EVENING TO REDUCE CHOLESTEROL 90 tablet 0   losartan (COZAAR) 50 MG tablet Take 1 tablet (50 mg total) by  mouth daily. 90 tablet 0   No current facility-administered medications on file prior to visit.    No Known Allergies  Social History   Socioeconomic History   Marital status: Single    Spouse name: Not on file   Number of children: Not on file   Years of education: Not on file   Highest education level: Not on file  Occupational History   Not on file  Tobacco Use   Smoking status: Never    Passive exposure: Past (roommate for 1 1/2 years)   Smokeless tobacco: Never  Vaping Use   Vaping status: Never Used  Substance and Sexual Activity   Alcohol use: Yes    Alcohol/week: 3.0 - 4.0 standard drinks of alcohol    Types: 3 - 4 Cans of beer per week    Comment: a night after work and socially on weekends   Drug use: Yes    Types: Marijuana    Comment: smoking   Sexual activity: Yes  Other Topics Concern   Not on file  Social History Narrative   Not on file   Social Determinants of Health   Financial Resource Strain: Not on file  Food Insecurity: Not on file  Transportation Needs: Not on  file  Physical Activity: Not on file  Stress: Not on file  Social Connections: Not on file  Intimate Partner Violence: Not on file    Family History  Problem Relation Age of Onset   Pancreatic cancer Mother    Hypertension Neg Hx    Diabetes Neg Hx    CAD Neg Hx    CVA Neg Hx    Colon cancer Neg Hx    Colon polyps Neg Hx    Esophageal cancer Neg Hx    Rectal cancer Neg Hx    Stomach cancer Neg Hx     Past Surgical History:  Procedure Laterality Date   HERNIA REPAIR  1999   abdominal    ROS: Review of Systems Negative except as stated above  PHYSICAL EXAM: BP 118/72 (BP Location: Left Arm, Patient Position: Sitting, Cuff Size: Normal)   Pulse 61   Ht 5\' 11"  (1.803 m)   Wt 194 lb (88 kg)   SpO2 99%   BMI 27.06 kg/m   Physical Exam   General appearance - alert, well appearing, older AAM and in no distress Mental status - normal mood, behavior, speech, dress,  motor activity, and thought processes Chest - clear to auscultation, no wheezes, rales or rhonchi, symmetric air entry Heart - normal rate, regular rhythm, normal S1, S2, no murmurs, rubs, clicks or gallops Extremities - peripheral pulses normal, no pedal edema, no clubbing or cyanosis  Lab Results  Component Value Date   IRON 59 12/09/2022   TIBC 331 12/09/2022   FERRITIN 151 12/09/2022       Latest Ref Rng & Units 12/09/2022   12:18 PM 11/29/2021    9:33 AM 12/15/2020    9:20 AM  CMP  Glucose 70 - 99 mg/dL 80  91  90   BUN 8 - 27 mg/dL 12  11  15    Creatinine 0.76 - 1.27 mg/dL 0.86  5.78  4.69   Sodium 134 - 144 mmol/L 139  141  137   Potassium 3.5 - 5.2 mmol/L 4.5  3.9  4.3   Chloride 96 - 106 mmol/L 101  102  99   CO2 20 - 29 mmol/L 25  24  25    Calcium 8.6 - 10.2 mg/dL 9.9  62.9  52.8   Total Protein 6.0 - 8.5 g/dL 7.6  7.4  7.7   Total Bilirubin 0.0 - 1.2 mg/dL 0.3  0.2  0.3   Alkaline Phos 44 - 121 IU/L 86  85  87   AST 0 - 40 IU/L 27  23  26    ALT 0 - 44 IU/L 24  14  17     Lipid Panel     Component Value Date/Time   CHOL 144 12/09/2022 1218   TRIG 46 12/09/2022 1218   HDL 55 12/09/2022 1218   CHOLHDL 2.6 12/09/2022 1218   LDLCALC 79 12/09/2022 1218    CBC    Component Value Date/Time   WBC 4.3 12/09/2022 1218   RBC 4.18 12/09/2022 1218   HGB 12.4 (L) 12/09/2022 1218   HCT 37.4 (L) 12/09/2022 1218   PLT 247 12/09/2022 1218   MCV 90 12/09/2022 1218   MCH 29.7 12/09/2022 1218   MCHC 33.2 12/09/2022 1218   RDW 12.9 12/09/2022 1218    ASSESSMENT AND PLAN: 1. Essential hypertension At goal. Continue Norvasc 10 mg and Cozaar 50 mg daily - amLODipine (NORVASC) 10 MG tablet; Take 1 tablet (10 mg total) by mouth daily.  Dispense: 90 tablet; Refill: 2 - losartan (COZAAR) 50 MG tablet; Take 1 tablet (50 mg total) by mouth daily.  Dispense: 90 tablet; Refill: 2  2. Normocytic anemia Iron studies from last visit suggestive of early iron def.  Will recheck  today - CBC - Iron, TIBC and Ferritin Panel - Vitamin B12  3. Pure hypercholesterolemia Lipid profile at goal 4 mths ago - atorvastatin (LIPITOR) 40 MG tablet; TAKE 1 TABLET (40 MG TOTAL) BY MOUTH DAILY. IN THE EVENING TO REDUCE CHOLESTEROL  Dispense: 90 tablet; Refill: 2  4. Influenza vaccination declined Recommended.  Pt declined. Also declines Tdapt after I explained to him what the Tdapt vaccine is and why it is recommended.       Patient was given the opportunity to ask questions.  Patient verbalized understanding of the plan and was able to repeat key elements of the plan.   This documentation was completed using Paediatric nurse.  Any transcriptional errors are unintentional.  No orders of the defined types were placed in this encounter.    Requested Prescriptions   Pending Prescriptions Disp Refills   atorvastatin (LIPITOR) 40 MG tablet 90 tablet 1    Sig: TAKE 1 TABLET (40 MG TOTAL) BY MOUTH DAILY. IN THE EVENING TO REDUCE CHOLESTEROL   amLODipine (NORVASC) 10 MG tablet 90 tablet 1    Sig: Take 1 tablet (10 mg total) by mouth daily.   losartan (COZAAR) 50 MG tablet 90 tablet 1    Sig: Take 1 tablet (50 mg total) by mouth daily.    No follow-ups on file.  Jonah Blue, MD, FACP

## 2023-04-11 LAB — CBC
Hematocrit: 36.8 % — ABNORMAL LOW (ref 37.5–51.0)
Hemoglobin: 12.1 g/dL — ABNORMAL LOW (ref 13.0–17.7)
MCH: 29.9 pg (ref 26.6–33.0)
MCHC: 32.9 g/dL (ref 31.5–35.7)
MCV: 91 fL (ref 79–97)
Platelets: 234 10*3/uL (ref 150–450)
RBC: 4.05 x10E6/uL — ABNORMAL LOW (ref 4.14–5.80)
RDW: 13.2 % (ref 11.6–15.4)
WBC: 4 10*3/uL (ref 3.4–10.8)

## 2023-04-11 LAB — IRON,TIBC AND FERRITIN PANEL
Ferritin: 126 ng/mL (ref 30–400)
Iron Saturation: 20 % (ref 15–55)
Iron: 70 ug/dL (ref 38–169)
Total Iron Binding Capacity: 342 ug/dL (ref 250–450)
UIBC: 272 ug/dL (ref 111–343)

## 2023-04-11 LAB — VITAMIN B12: Vitamin B-12: 582 pg/mL (ref 232–1245)

## 2023-05-22 ENCOUNTER — Other Ambulatory Visit: Payer: Self-pay

## 2023-05-23 ENCOUNTER — Other Ambulatory Visit: Payer: Self-pay

## 2023-08-11 ENCOUNTER — Ambulatory Visit: Payer: Self-pay | Attending: Internal Medicine | Admitting: Internal Medicine

## 2023-08-11 ENCOUNTER — Encounter: Payer: Self-pay | Admitting: Internal Medicine

## 2023-08-11 VITALS — BP 125/76 | HR 71 | Temp 97.6°F | Ht 71.0 in | Wt 202.0 lb

## 2023-08-11 DIAGNOSIS — E7800 Pure hypercholesterolemia, unspecified: Secondary | ICD-10-CM

## 2023-08-11 DIAGNOSIS — Z2821 Immunization not carried out because of patient refusal: Secondary | ICD-10-CM

## 2023-08-11 DIAGNOSIS — I10 Essential (primary) hypertension: Secondary | ICD-10-CM

## 2023-08-11 NOTE — Progress Notes (Signed)
 Patient ID: Arthur Jenkins, male    DOB: April 30, 1961  MRN: [id number]  CC: Hypertension (HTN f/u. Med refill. /Discuss signs for cancer /No to Tdap vax.)   Subjective: Arthur Jenkins is a 63 y.o. male who presents for chronic ds management. His concerns today include:  Patient with history of HL, HTN, colon polyps   HYPERTENSION Currently taking: see medication list. He is on Norvasc  10 mg daily and Cozaar  50 mg daily.  Med Adherence: [x]  Yes    []  No Medication side effects: []  Yes    [x]  No Adherence with salt restriction: [x]  Yes    []  No Home Monitoring?: []  Yes    [x]  No but has device Monitoring Frequency:  Home BP results range:  SOB? []  Yes    [x]  No Chest Pain?: []  Yes    [x]  No Leg swelling?: []  Yes    [x]  No Headaches?: []  Yes    [x]  No Dizziness? []  Yes    [x]  No Comments: Taking and tolerating the Lipitor 40 mg daily.  Last LDL was 79. He has a mild stable anemia with last two Hb of 12.4 and 12.1.  Recent co-worker died from stomach cancer. Wanted to know signs/symptoms of that.    Rides bike 4 days a wk for 30 mins then stretching and push for 15 mins  HM:  decline Tdapt.    Patient Active Problem List   Diagnosis Date Noted   Adenomatous polyp of colon 07/19/2021   Positive FIT (fecal immunochemical test) 12/17/2020   Essential hypertension 05/30/2018   Hyperlipidemia 05/30/2018   History of shingles 05/30/2018     Current Outpatient Medications on File Prior to Visit  Medication Sig Dispense Refill   amLODipine  (NORVASC ) 10 MG tablet Take 1 tablet (10 mg total) by mouth daily. 90 tablet 2   atorvastatin  (LIPITOR) 40 MG tablet TAKE 1 TABLET (40 MG TOTAL) BY MOUTH DAILY. IN THE EVENING TO REDUCE CHOLESTEROL 90 tablet 2   losartan  (COZAAR ) 50 MG tablet Take 1 tablet (50 mg total) by mouth daily. 90 tablet 2   No current facility-administered medications on file prior to visit.    No Known Allergies  Social History   Socioeconomic History    Marital status: Single    Spouse name: Not on file   Number of children: Not on file   Years of education: Not on file   Highest education level: Not on file  Occupational History   Not on file  Tobacco Use   Smoking status: Never    Passive exposure: Past (roommate for 1 1/2 years)   Smokeless tobacco: Never  Vaping Use   Vaping status: Never Used  Substance and Sexual Activity   Alcohol use: Yes    Alcohol/week: 3.0 - 4.0 standard drinks of alcohol    Types: 3 - 4 Cans of beer per week    Comment: a night after work and socially on weekends   Drug use: Yes    Types: Marijuana    Comment: smoking   Sexual activity: Yes  Other Topics Concern   Not on file  Social History Narrative   Not on file   Social Drivers of Health   Financial Resource Strain: Low Risk  (08/11/2023)   Overall Financial Resource Strain (CARDIA)    Difficulty of Paying Living Expenses: Not hard at all  Food Insecurity: No Food Insecurity (08/11/2023)   Hunger Vital Sign    Worried About Running Out of Food  in the Last Year: Never true    Ran Out of Food in the Last Year: Never true  Transportation Needs: No Transportation Needs (08/11/2023)   PRAPARE - Administrator, Civil Service (Medical): No    Lack of Transportation (Non-Medical): No  Physical Activity: Sufficiently Active (08/11/2023)   Exercise Vital Sign    Days of Exercise per Week: 4 days    Minutes of Exercise per Session: 40 min  Stress: No Stress Concern Present (08/11/2023)   [employer] of Occupational Health - Occupational Stress Questionnaire    Feeling of Stress : Not at all  Social Connections: Socially Integrated (08/11/2023)   Social Connection and Isolation Panel [NHANES]    Frequency of Communication with Friends and Family: More than three times a week    Frequency of Social Gatherings with Friends and Family: More than three times a week    Attends Religious Services: More than 4 times per year    Active  Member of [employer] or Organizations: Yes    Attends Engineer, Structural: More than 4 times per year    Marital Status: Married  Catering Manager Violence: Not At Risk (08/11/2023)   Humiliation, Afraid, Rape, and Kick questionnaire    Fear of Current or Ex-Partner: No    Emotionally Abused: No    Physically Abused: No    Sexually Abused: No    Family History  Problem Relation Age of Onset   Pancreatic cancer Mother    Hypertension Neg Hx    Diabetes Neg Hx    CAD Neg Hx    CVA Neg Hx    Colon cancer Neg Hx    Colon polyps Neg Hx    Esophageal cancer Neg Hx    Rectal cancer Neg Hx    Stomach cancer Neg Hx     Past Surgical History:  Procedure Laterality Date   HERNIA REPAIR  1999   abdominal    ROS: Review of Systems Negative except as stated above  PHYSICAL EXAM: BP 125/76   Pulse 71   Temp 97.6 F (36.4 C) (Oral)   Ht 5' 11 (1.803 m)   Wt 202 lb (91.6 kg)   SpO2 99%   BMI 28.17 kg/m   Wt Readings from Last 3 Encounters:  08/11/23 202 lb (91.6 kg)  04/10/23 194 lb (88 kg)  12/09/22 190 lb (86.2 kg)    Physical Exam   General appearance - alert, well appearing, older African-American male and in no distress Mental status - normal mood, behavior, speech, dress, motor activity, and thought processes Chest - clear to auscultation, no wheezes, rales or rhonchi, symmetric air entry Heart - normal rate, regular rhythm, normal S1, S2, no murmurs, rubs, clicks or gallops Extremities - peripheral pulses normal, no pedal edema, no clubbing or cyanosis     Latest Ref Rng & Units 12/09/2022   12:18 PM 11/29/2021    9:33 AM 12/15/2020    9:20 AM  CMP  Glucose 70 - 99 mg/dL 80  91  90   BUN 8 - 27 mg/dL 12  11  15    Creatinine 0.76 - 1.27 mg/dL 9.02  9.14  9.04   Sodium 134 - 144 mmol/L 139  141  137   Potassium 3.5 - 5.2 mmol/L 4.5  3.9  4.3   Chloride 96 - 106 mmol/L 101  102  99   CO2 20 - 29 mmol/L 25  24  25    Calcium  8.6 -  10.2 mg/dL 9.9  89.9  89.9    Total Protein 6.0 - 8.5 g/dL 7.6  7.4  7.7   Total Bilirubin 0.0 - 1.2 mg/dL 0.3  0.2  0.3   Alkaline Phos 44 - 121 IU/L 86  85  87   AST 0 - 40 IU/L 27  23  26    ALT 0 - 44 IU/L 24  14  17     Lipid Panel     Component Value Date/Time   CHOL 144 12/09/2022 1218   TRIG 46 12/09/2022 1218   HDL 55 12/09/2022 1218   CHOLHDL 2.6 12/09/2022 1218   LDLCALC 79 12/09/2022 1218    CBC    Component Value Date/Time   WBC 4.0 04/10/2023 1121   RBC 4.05 (L) 04/10/2023 1121   HGB 12.1 (L) 04/10/2023 1121   HCT 36.8 (L) 04/10/2023 1121   PLT 234 04/10/2023 1121   MCV 91 04/10/2023 1121   MCH 29.9 04/10/2023 1121   MCHC 32.9 04/10/2023 1121   RDW 13.2 04/10/2023 1121    ASSESSMENT AND PLAN: 1. Essential hypertension (Primary) At goal.  Continue amlodipine  10 mg daily and Cozaar  50 mg daily.  2. Pure hypercholesterolemia Continue atorvastatin  40 mg daily.  3.  Declined Tdapt.  Patient is up-to-date with age-appropriate cancer screenings.  We discussed signs and symptoms of gastric CA.  He does not have any chronic abdominal pain, blood in the stools or black stools, unexplained weight loss.  He does have a mild anemia but that is stable.    Patient was given the opportunity to ask questions.  Patient verbalized understanding of the plan and was able to repeat key elements of the plan.   This documentation was completed using Paediatric nurse.  Any transcriptional errors are unintentional.  No orders of the defined types were placed in this encounter.    Requested Prescriptions    No prescriptions requested or ordered in this encounter    Return in about 4 months (around 12/09/2023).  Barnie Louder, MD, FACP

## 2023-08-24 ENCOUNTER — Other Ambulatory Visit: Payer: Self-pay

## 2023-08-25 ENCOUNTER — Other Ambulatory Visit: Payer: Self-pay

## 2023-11-24 ENCOUNTER — Other Ambulatory Visit: Payer: Self-pay

## 2023-11-27 ENCOUNTER — Other Ambulatory Visit: Payer: Self-pay

## 2023-12-15 ENCOUNTER — Ambulatory Visit: Payer: Self-pay | Attending: Internal Medicine | Admitting: Internal Medicine

## 2024-02-13 ENCOUNTER — Other Ambulatory Visit (HOSPITAL_COMMUNITY): Payer: Self-pay

## 2024-02-13 ENCOUNTER — Other Ambulatory Visit: Payer: Self-pay

## 2024-02-13 MED ORDER — MELOXICAM 7.5 MG PO TABS
7.5000 mg | ORAL_TABLET | Freq: Every day | ORAL | 1 refills | Status: AC | PRN
Start: 2024-02-13 — End: ?
  Filled 2024-02-13: qty 30, 30d supply, fill #0

## 2024-02-13 MED ORDER — TIZANIDINE HCL 2 MG PO TABS
2.0000 mg | ORAL_TABLET | Freq: Three times a day (TID) | ORAL | 0 refills | Status: AC | PRN
Start: 2024-02-13 — End: ?
  Filled 2024-02-13: qty 35, 6d supply, fill #0

## 2024-02-13 NOTE — Progress Notes (Signed)
 [facility] [address] [place], [place] [address] P: [phone]  F: [phone]  Spine Surgery Clinic Note   Arthur Jenkins  [birth year]  Referred by: Self, Referred, MD  02/13/2024   CC: Low back pain  HPI:   Patient is a 63 y.o. male who presents for evaluation of low back pain.  He thinks that he injured his back when he was lifting a heavy case of water 4 days ago.  His pain intensified yesterday.  He describes predominantly left-sided low back pain that is sharp and aching in character.  The pain does not radiate down his leg.  He has no neurologic symptoms in his bilateral lower extremities including no pain, numbness, or weakness.  He has been taking ibuprofen and using Biofreeze for pain control.  No recent physical therapy or injections.    ROS: Review of systems negative unless otherwise noted above in history for constitutional, HEENT, eyes, respiratory, cardiology, GI, endocrine, GU, heme, skin, psychiatric.  Current Outpatient Medications on File Prior to Visit  Medication Sig Dispense Refill  . amLODIPine  (Norvasc ) 10 MG tablet Take 1 tablet (10 mg) by mouth.    . atorvastatin  (Lipitor) 40 MG tablet Take 1 tablet (40 mg) by mouth.    . losartan  (Cozaar ) 50 MG tablet every 6 (six) hours if needed.     No current facility-administered medications on file prior to visit.      Allergies as of 02/13/2024  . (No Known Allergies)     History reviewed. No pertinent past medical history.   History reviewed. No pertinent surgical history.   Social History   Socioeconomic History  . Marital status: Married    Spouse name: Not on file  . Number of children: Not on file  . Years of education: Not on file  . Highest education level: Not on file  Occupational History  . Not on file  Tobacco Use  . Smoking status: Never  . Smokeless tobacco: Never  Substance and Sexual Activity  . Alcohol use: Not Currently  . Drug  use: Never  . Sexual activity: Not on file  Other Topics Concern  . Not on file  Social History Narrative  . Not on file   Social Drivers of Health   Financial Resource Strain: Low Risk  (08/11/2023)   Received from [facility]   Overall Financial Resource Strain (CARDIA)   . Difficulty of Paying Living Expenses: Not hard at all  Food Insecurity: No Food Insecurity (08/11/2023)   Received from [facility]   Hunger Vital Sign   . Worried About Programme researcher, broadcasting/film/video in the Last Year: Never true   . Ran Out of Food in the Last Year: Never true  Transportation Needs: No Transportation Needs (08/11/2023)   Received from [facility] - Transportation   . Lack of Transportation (Medical): No   . Lack of Transportation (Non-Medical): No  Physical Activity: Sufficiently Active (08/11/2023)   Received from [facility]   Exercise Vital Sign   . Days of Exercise per Week: 4 days   . Minutes of Exercise per Session: 40 min  Stress: No Stress Concern Present (08/11/2023)   Received from [facility] of Occupational Health - Occupational Stress Questionnaire   . Feeling of Stress : Not at all  Social Connections: Socially Integrated (08/11/2023)   Received from [facility]   Social Connection and Isolation Panel [NHANES]   . Frequency  of Communication with Friends and Family: More than three times a week   . Frequency of Social Gatherings with Friends and Family: More than three times a week   . Attends Religious Services: More than 4 times per year   . Active Member of Clubs or Organizations: Yes   . Attends Banker Meetings: More than 4 times per year   . Marital Status: Married  Catering manager Violence: Not At Risk (08/11/2023)   Received from [facility]   Humiliation, Afraid, Rape, and Kick questionnaire   . Fear of Current or Ex-Partner: No   . Emotionally Abused: No   . Physically Abused: No   . Sexually Abused: No  Housing Stability: Low Risk   (08/11/2023)   Received from [facility] Stability Vital Sign   . Unable to Pay for Housing in the Last Year: No   . Number of Times Moved in the Last Year: 0   . Homeless in the Last Year: No     Family History  Problem Relation Name Age of Onset  . No Known Problems Family History           Visit Vitals Ht 5' 11.5 (1.816 m)  Wt 180 lb (81.6 kg)  BMI 24.76 kg/m  Smoking Status Never  BSA 2.03 m       Physical Exam:   GENERAL  Alert, Oriented, in no apparent distress.  Extra-ocular movements intact, pupils reactive  Easy work of breathing on Room Air  No peripheral edema  Abdomen soft, non-distended  Warm and well perfused digits with 2+ DP pulses bilaterally, no dermatologic changes suggestive of peripheral vascular disease (loss of hair, shiny skin, lipodermatosclerosis)   NEUROLOGIC EXAM:  Gait is normal Muscle Atrophy:  None   THORACOLUMBAR  Examination of the lumbar spine demonstrates no prior surgical incisions. There is no midline tenderness to palpation. There is no paraspinal tenderness to palpation. There is no tenderness to palpation over the sacroiliac joints bilaterally.   Lumbar range of motion is somewhat limited due to pain  Sensation is intact to light touch from L2-S1 nerve root distributions   Motor LE   IP Quad TA EHL GS  Right 5 5 5 5 5   Left 5 5 5 5 5    Patient exhibits symmetric 2+ bilateral patella and Achilles reflexes SLR negative bilaterally  No Clonus    Labs:  No results for input(s): WBC, HGB, HCT, PLT, NA, K, BUN, GLU, INR in the last 72 hours.  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  No overt fracture or instability.    Assessment/Plan:    Patient is a 63 y.o. male with  acute left-sided axial low back pain likely secondary to muscle strain.  We discussed axial back pain, back strains, degenerative disc disease, and lumbar spondylosis. We discussed the role of conservative cares in treating back pain. This includes activity modification, physical therapy, medications including NSAIDs for flares, and injections. We discussed the relapsing nature of back pain. We discussed a spine-healthy lifestyle including a healthy diet, weight control, staying active, and maintaining core strength, stability, and lower extremity flexibility. We discussed that there is generally not a good surgical treatment for axial back pain unless it is associated with a mechanical/structural abnormality, radicular  leg pain, weakness, or other neurologic signs/symptoms.   Today I sent prescriptions for meloxicam  and tizanidine  to his pharmacy.  He will follow-up with us  as needed if his symptoms do not improve.   Waddell Lager, MD Orthopaedic Spine Surgeon [facility]  ------------------------------------------------   Patient was counseled to go to the emergency room if any concerning symptoms develop, including but not limited to worsening pain, numbness, weakness, bowel or bladder problems, difficulty with balance or gait, or any other concerning symptoms. ?Patient asked thoughtful questions and voiced back understanding of these issues that would require urgent or emergent attention.??   This note was generated with a voice recognition program. Please excuse any errors which may have been overlooked during my review of this note. Sometimes, these errors may affect the content or meaning of a given sentence.

## 2024-02-14 ENCOUNTER — Ambulatory Visit (HOSPITAL_COMMUNITY): Payer: Self-pay

## 2024-02-27 ENCOUNTER — Telehealth: Payer: Self-pay | Admitting: Internal Medicine

## 2024-02-27 ENCOUNTER — Other Ambulatory Visit: Payer: Self-pay | Admitting: Internal Medicine

## 2024-02-27 ENCOUNTER — Other Ambulatory Visit: Payer: Self-pay

## 2024-02-27 DIAGNOSIS — I10 Essential (primary) hypertension: Secondary | ICD-10-CM

## 2024-02-27 NOTE — Telephone Encounter (Unsigned)
 Copied from CRM [id number]. Topic: Clinical - Medication Refill >> Feb 27, 2024 11:46 AM Fonda T wrote: Medication: losartan  (COZAAR ) 50 MG tablet  amLODipine  (NORVASC ) 10 MG tablet   Has the patient contacted their pharmacy? Yes (Agent: If no, request that the patient contact the pharmacy for the refill. If patient does not wish to contact the pharmacy document the reason why and proceed with request.) (Agent: If yes, when and what did the pharmacy advise?)  This is the patient's preferred pharmacy:  [facility] [address] [place] [address] Phone: [phone] Fax: [phone]  Is this the correct pharmacy for this prescription? Yes If no, delete pharmacy and type the correct one.   Has the prescription been filled recently? Yes  Is the patient out of the medication? Yes, last dose taken today (02/27/24)  Has the patient been seen for an appointment in the last year OR does the patient have an upcoming appointment? Yes  Can we respond through MyChart? Yes  Agent: Please be advised that Rx refills may take up to 3 business days. We ask that you follow-up with your pharmacy.

## 2024-02-27 NOTE — Telephone Encounter (Signed)
 Copied from CRM [id number]. Topic: Clinical - Medication Question >> Feb 27, 2024 11:50 AM Treva T wrote:  Reason for CRM: Patient called to requested medication refill for blood pressure medications, he states he took his last pill today, and will need medication refilled for tomorrow's dose. Informed patient of 3 business day processing time. Patient requesting a follow up once medications have be refilled as soon as possible, can be reached at [phone].  Medications refill requested:  losartan  (COZAAR ) 50 MG tablet amLODipine  (NORVASC ) 10 MG tablet  [facility] [address], [address] [place] [address] Phone: [phone] Fax: [phone]

## 2024-02-28 ENCOUNTER — Other Ambulatory Visit: Payer: Self-pay

## 2024-02-28 MED ORDER — LOSARTAN POTASSIUM 50 MG PO TABS
50.0000 mg | ORAL_TABLET | Freq: Every day | ORAL | 0 refills | Status: DC
  Filled 2024-02-29: qty 15, 15d supply, fill #0

## 2024-02-28 MED ORDER — LOSARTAN POTASSIUM 50 MG PO TABS
50.0000 mg | ORAL_TABLET | Freq: Every day | ORAL | 0 refills | Status: DC
  Filled 2024-02-28: qty 90, 90d supply, fill #0

## 2024-02-28 MED ORDER — AMLODIPINE BESYLATE 10 MG PO TABS
10.0000 mg | ORAL_TABLET | Freq: Every day | ORAL | 0 refills | Status: DC
  Filled 2024-02-28: qty 90, 90d supply, fill #0

## 2024-02-28 MED ORDER — AMLODIPINE BESYLATE 10 MG PO TABS
10.0000 mg | ORAL_TABLET | Freq: Every day | ORAL | 0 refills | Status: DC
  Filled 2024-02-29 (×2): qty 15, 15d supply, fill #0

## 2024-02-28 NOTE — Telephone Encounter (Signed)
 Enough medication until his up coming appointment

## 2024-02-29 ENCOUNTER — Other Ambulatory Visit: Payer: Self-pay

## 2024-02-29 ENCOUNTER — Other Ambulatory Visit (HOSPITAL_COMMUNITY): Payer: Self-pay

## 2024-03-02 ENCOUNTER — Encounter: Payer: Self-pay | Admitting: Gastroenterology

## 2024-03-12 ENCOUNTER — Other Ambulatory Visit: Payer: Self-pay

## 2024-03-12 ENCOUNTER — Ambulatory Visit: Payer: Self-pay | Attending: Internal Medicine | Admitting: Internal Medicine

## 2024-03-12 ENCOUNTER — Encounter: Payer: Self-pay | Admitting: Internal Medicine

## 2024-03-12 VITALS — BP 119/75 | HR 74 | Ht 71.0 in | Wt 181.0 lb

## 2024-03-12 DIAGNOSIS — Z2821 Immunization not carried out because of patient refusal: Secondary | ICD-10-CM

## 2024-03-12 DIAGNOSIS — I10 Essential (primary) hypertension: Secondary | ICD-10-CM

## 2024-03-12 DIAGNOSIS — R634 Abnormal weight loss: Secondary | ICD-10-CM

## 2024-03-12 DIAGNOSIS — E7800 Pure hypercholesterolemia, unspecified: Secondary | ICD-10-CM

## 2024-03-12 DIAGNOSIS — Z860100 Personal history of colon polyps, unspecified: Secondary | ICD-10-CM

## 2024-03-12 DIAGNOSIS — Z6825 Body mass index (BMI) 25.0-25.9, adult: Secondary | ICD-10-CM

## 2024-03-12 DIAGNOSIS — Z125 Encounter for screening for malignant neoplasm of prostate: Secondary | ICD-10-CM

## 2024-03-12 MED ORDER — AMLODIPINE BESYLATE 10 MG PO TABS
10.0000 mg | ORAL_TABLET | Freq: Every day | ORAL | 1 refills | Status: AC
  Filled 2024-03-12: qty 90, 90d supply, fill #0
  Filled 2024-06-07 (×2): qty 90, 90d supply, fill #1

## 2024-03-12 MED ORDER — LOSARTAN POTASSIUM 50 MG PO TABS
50.0000 mg | ORAL_TABLET | Freq: Every day | ORAL | 1 refills | Status: AC
Start: 2024-03-12 — End: ?
  Filled 2024-03-12: qty 90, 90d supply, fill #0
  Filled 2024-06-07 (×2): qty 90, 90d supply, fill #1

## 2024-03-12 MED ORDER — ATORVASTATIN CALCIUM 40 MG PO TABS
40.0000 mg | ORAL_TABLET | Freq: Every evening | ORAL | 1 refills | Status: AC
  Filled 2024-03-12: qty 90, 90d supply, fill #0
  Filled 2024-06-07 (×2): qty 90, 90d supply, fill #1

## 2024-03-12 NOTE — Progress Notes (Signed)
 Patient ID: Arthur Jenkins, male    DOB: April 23, 1961  MRN: [id number]  CC: Hypertension (HTN f/u. Med refills. /Discuss goals to stop meds)   Subjective: Arthur Jenkins is a 63 y.o. male who presents for chronic ds management. His concerns today include:  Patient with history of HL, HTN, colon polyps   Discussed the use of AI scribe software for clinical note transcription with the patient, who gave verbal consent to proceed.  History of Present Illness Arthur Jenkins is a 63 year old male with hypertension and hyperlipidemia who presents for follow-up on blood pressure and cholesterol management.  HTN/HL: He has been taking amlodipine  10 mg daily and Cozaar  50 mg daily since January. He has not been checking his blood pressure regularly but has a device at home. He is trying to limit salt intake in his diet. No chest pain, shortness of breath, leg swelling, or chronic headaches. He is still taking atorvastatin  40 mg daily for cholesterol management.  I note today that he has experienced significant weight loss, dropping from 202 pounds in January to 181 pounds currently, a loss of 21 pounds. 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 He also tries to eat dinner before 8 PM. He notes that he tends to weigh less in the summer due to increased activity and sweating then regains in the winter when landscaping job is less.  No symptoms such as heart racing, feeling hot, diarrhea, blood in stools or urine, chronic cough, or shortness of breath. He reports normal urination.  HM: He had his last colonoscopy in July 2022, where three polyps were removed, one of which was precancerous. He is due for repeat c-scope.    Patient Active Problem List   Diagnosis Date Noted   Adenomatous polyp of colon 07/19/2021   Positive FIT (fecal  immunochemical test) 12/17/2020   Essential hypertension 05/30/2018   Hyperlipidemia 05/30/2018   History of shingles 05/30/2018     Current Outpatient Medications on File Prior to Visit  Medication Sig Dispense Refill   meloxicam  (MOBIC ) 7.5 MG tablet Take 1 tablet (7.5 mg total) by mouth daily as needed for mild or moderate pain. 30 tablet 1   tiZANidine  (ZANAFLEX ) 2 MG tablet Take 1-2 tablets (2-4 mg total) by mouth every 8 (eight) hours as needed for muscle spasms for up to 21 days. 35 tablet 0   No current facility-administered medications on file prior to visit.    No Known Allergies  Social History   Socioeconomic History   Marital status: Single    Spouse name: Not on file   Number of children: Not on file   Years of education: Not on file   Highest education level: Not on file  Occupational History   Not on file  Tobacco Use   Smoking status: Never    Passive exposure: Past (roommate for 1 1/2 years)   Smokeless tobacco: Never  Vaping Use   Vaping status: Never Used  Substance and Sexual Activity   Alcohol use: Yes    Alcohol/week: 3.0 - 4.0 standard drinks of alcohol    Types: 3 - 4 Cans of beer per week    Comment: a night after work and socially on weekends   Drug use: Yes    Types: Marijuana    Comment: smoking   Sexual activity: Yes  Other Topics  Concern   Not on file  Social History Narrative   Not on file   Social Drivers of Health   Financial Resource Strain: Low Risk  (08/11/2023)   Overall Financial Resource Strain (CARDIA)    Difficulty of Paying Living Expenses: Not hard at all  Food Insecurity: No Food Insecurity (08/11/2023)   Hunger Vital Sign    Worried About Running Out of Food in the Last Year: Never true    Ran Out of Food in the Last Year: Never true  Transportation Needs: No Transportation Needs (08/11/2023)   PRAPARE - Administrator, Civil Service (Medical): No    Lack of Transportation (Non-Medical): No  Physical  Activity: Sufficiently Active (08/11/2023)   Exercise Vital Sign    Days of Exercise per Week: 4 days    Minutes of Exercise per Session: 40 min  Stress: No Stress Concern Present (08/11/2023)   [employer] of Occupational Health - Occupational Stress Questionnaire    Feeling of Stress : Not at all  Social Connections: Socially Integrated (08/11/2023)   Social Connection and Isolation Panel    Frequency of Communication with Friends and Family: More than three times a week    Frequency of Social Gatherings with Friends and Family: More than three times a week    Attends Religious Services: More than 4 times per year    Active Member of [employer] or Organizations: Yes    Attends Engineer, structural: More than 4 times per year    Marital Status: Married  Catering manager Violence: Not At Risk (08/11/2023)   Humiliation, Afraid, Rape, and Kick questionnaire    Fear of Current or Ex-Partner: No    Emotionally Abused: No    Physically Abused: No    Sexually Abused: No    Family History  Problem Relation Age of Onset   Pancreatic cancer Mother    Hypertension Neg Hx    Diabetes Neg Hx    CAD Neg Hx    CVA Neg Hx    Colon cancer Neg Hx    Colon polyps Neg Hx    Esophageal cancer Neg Hx    Rectal cancer Neg Hx    Stomach cancer Neg Hx     Past Surgical History:  Procedure Laterality Date   HERNIA REPAIR  1999   abdominal    ROS: Review of Systems Negative except as stated above  PHYSICAL EXAM: BP 119/75 (BP Location: Left Arm, Patient Position: Sitting, Cuff Size: Normal)   Pulse 74   Ht 5' 11 (1.803 m)   Wt 181 lb (82.1 kg)   SpO2 100%   BMI 25.24 kg/m   Wt Readings from Last 3 Encounters:  03/12/24 181 lb (82.1 kg)  08/11/23 202 lb (91.6 kg)  04/10/23 194 lb (88 kg)    Physical Exam General appearance - alert, well appearing, older AAM with noted wgh loss and in no distress Mental status - normal mood, behavior, speech, dress, motor activity, and  thought processes Neck - supple, no significant adenopathy, no thyroid enlargement Chest - clear to auscultation, no wheezes, rales or rhonchi, symmetric air entry Heart - normal rate, regular rhythm, normal S1, S2, no murmurs, rubs, clicks or gallops Abdomen: soft, NT, no organomegaly. No abn masses Extremities - peripheral pulses normal, no pedal edema, no clubbing or cyanosis     Latest Ref Rng & Units 12/09/2022   12:18 PM 11/29/2021    9:33 AM 12/15/2020    9:20  AM  CMP  Glucose 70 - 99 mg/dL 80  91  90   BUN 8 - 27 mg/dL 12  11  15    Creatinine 0.76 - 1.27 mg/dL 9.02  9.14  9.04   Sodium 134 - 144 mmol/L 139  141  137   Potassium 3.5 - 5.2 mmol/L 4.5  3.9  4.3   Chloride 96 - 106 mmol/L 101  102  99   CO2 20 - 29 mmol/L 25  24  25    Calcium  8.6 - 10.2 mg/dL 9.9  89.9  89.9   Total Protein 6.0 - 8.5 g/dL 7.6  7.4  7.7   Total Bilirubin 0.0 - 1.2 mg/dL 0.3  0.2  0.3   Alkaline Phos 44 - 121 IU/L 86  85  87   AST 0 - 40 IU/L 27  23  26    ALT 0 - 44 IU/L 24  14  17     Lipid Panel     Component Value Date/Time   CHOL 144 12/09/2022 1218   TRIG 46 12/09/2022 1218   HDL 55 12/09/2022 1218   CHOLHDL 2.6 12/09/2022 1218   LDLCALC 79 12/09/2022 1218    CBC    Component Value Date/Time   WBC 4.0 04/10/2023 1121   RBC 4.05 (L) 04/10/2023 1121   HGB 12.1 (L) 04/10/2023 1121   HCT 36.8 (L) 04/10/2023 1121   PLT 234 04/10/2023 1121   MCV 91 04/10/2023 1121   MCH 29.9 04/10/2023 1121   MCHC 32.9 04/10/2023 1121   RDW 13.2 04/10/2023 1121    ASSESSMENT AND PLAN: 1. Essential hypertension (Primary) Blood pressure at goal.  Advised to continue amlodipine  and Cozaar .  Patient wanted to know whether he is able to come off medication but I recommended that he stay on both of them for now.  In future if blood pressure remains below 120/80 we may decide to decrease the dose of one of his medications or stop 1 altogether. - amLODipine  (NORVASC ) 10 MG tablet; Take 1 tablet (10 mg total)  by mouth daily.  Dispense: 90 tablet; Refill: 1 - losartan  (COZAAR ) 50 MG tablet; Take 1 tablet (50 mg total) by mouth daily.  Dispense: 90 tablet; Refill: 1 - CBC - Comprehensive metabolic panel with GFR  2. Pure hypercholesterolemia Continue atorvastatin  recheck lipid profile today. - atorvastatin  (LIPITOR) 40 MG tablet; TAKE 1 TABLET (40 MG TOTAL) BY MOUTH DAILY. IN THE EVENING TO REDUCE CHOLESTEROL  Dispense: 90 tablet; Refill: 1 - Lipid panel  3. History of colon polyps Encouraged him to apply for Medicaid over-the-counter discount card - Ambulatory referral to Gastroenterology  4. Weight loss, unintentional Seems to be related to increased physical activity during the summer months as he reported above.  At this time he does not have any worrisome symptoms.  We will check thyroid hormone levels nonetheless and get him up-to-date with age-appropriate cancer screenings. - TSH+T4F+T3Free  5. Screening for prostate cancer - PSA  6. Pneumococcal vaccination declined    Patient was given the opportunity to ask questions.  Patient verbalized understanding of the plan and was able to repeat key elements of the plan.   This documentation was completed using Paediatric nurse.  Any transcriptional errors are unintentional.  Orders Placed This Encounter  Procedures   CBC   Comprehensive metabolic panel with GFR   Lipid panel   PSA   TSH+T4F+T3Free   Ambulatory referral to Gastroenterology     Requested Prescriptions  Signed Prescriptions Disp Refills   amLODipine  (NORVASC ) 10 MG tablet 90 tablet 1    Sig: Take 1 tablet (10 mg total) by mouth daily.   atorvastatin  (LIPITOR) 40 MG tablet 90 tablet 1    Sig: TAKE 1 TABLET (40 MG TOTAL) BY MOUTH DAILY. IN THE EVENING TO REDUCE CHOLESTEROL   losartan  (COZAAR ) 50 MG tablet 90 tablet 1    Sig: Take 1 tablet (50 mg total) by mouth daily.    Return in about 6 months (around 09/12/2024).  Barnie Louder, MD,  FACP

## 2024-03-12 NOTE — Patient Instructions (Signed)
 VISIT SUMMARY:  Today, you came in for a follow-up on your blood pressure and cholesterol management. We discussed your current medications, recent weight loss, and your history of colon polyps.  YOUR PLAN:  -HYPERTENSION: Hypertension means high blood pressure. Your blood pressure is well controlled at 119/75 mmHg with your current medications. Continue taking amlodipine  10 mg daily and lorazepam 50 mg daily. Please monitor your blood pressure at home regularly and keep limiting your salt intake.  -HYPERLIPIDEMIA: Hyperlipidemia means high cholesterol levels. You are managing this with atorvastatin . Continue taking atorvastatin  40 mg daily. We will check your cholesterol levels with a blood test.  -ABNORMAL WEIGHT LOSS: You have lost 21 pounds, which you attribute to increased physical activity and dietary changes. There are no signs of other conditions causing this weight loss. We will order blood tests to check your kidney function, liver function, thyroid level, and screen for prostate cancer.  -HISTORY OF COLON POLYPS: You had three polyps removed during your last colonoscopy in July 2022, one of which was precancerous. You are due for another colonoscopy. We will submit a referral for this procedure.  INSTRUCTIONS:  Please follow up with the blood tests for kidney function, liver function, thyroid level, and prostate cancer screening. Also, schedule your colonoscopy as soon as you receive the referral letter.

## 2024-03-13 ENCOUNTER — Ambulatory Visit: Payer: Self-pay | Admitting: Internal Medicine

## 2024-03-13 LAB — LIPID PANEL
Chol/HDL Ratio: 2.2 ratio (ref 0.0–5.0)
Cholesterol, Total: 140 mg/dL (ref 100–199)
HDL: 64 mg/dL (ref >39)
LDL Chol Calc (NIH): 66 mg/dL (ref 0–99)
Triglycerides: 42 mg/dL (ref 0–149)
VLDL Cholesterol Cal: 10 mg/dL (ref 5–40)

## 2024-03-13 LAB — COMPREHENSIVE METABOLIC PANEL WITH GFR
ALT: 17 IU/L (ref 0–44)
AST: 21 IU/L (ref 0–40)
Albumin: 4.9 g/dL (ref 3.9–4.9)
Alkaline Phosphatase: 89 IU/L (ref 44–121)
BUN/Creatinine Ratio: 8 — ABNORMAL LOW (ref 10–24)
BUN: 8 mg/dL (ref 8–27)
Bilirubin Total: 0.3 mg/dL (ref 0.0–1.2)
CO2: 22 mmol/L (ref 20–29)
Calcium: 9.9 mg/dL (ref 8.6–10.2)
Chloride: 101 mmol/L (ref 96–106)
Creatinine, Ser: 0.96 mg/dL (ref 0.76–1.27)
Globulin, Total: 2.5 g/dL (ref 1.5–4.5)
Glucose: 90 mg/dL (ref 70–99)
Potassium: 4.8 mmol/L (ref 3.5–5.2)
Sodium: 138 mmol/L (ref 134–144)
Total Protein: 7.4 g/dL (ref 6.0–8.5)
eGFR: 89 mL/min/1.73 (ref >59)

## 2024-03-13 LAB — TSH+T4F+T3FREE
Free T4: 1.13 ng/dL (ref 0.82–1.77)
T3, Free: 2.3 pg/mL (ref 2.0–4.4)
TSH: 0.772 u[IU]/mL (ref 0.450–4.500)

## 2024-03-13 LAB — CBC
Hematocrit: 39.4 % (ref 37.5–51.0)
Hemoglobin: 12.6 g/dL — ABNORMAL LOW (ref 13.0–17.7)
MCH: 29.6 pg (ref 26.6–33.0)
MCHC: 32 g/dL (ref 31.5–35.7)
MCV: 93 fL (ref 79–97)
Platelets: 219 x10E3/uL (ref 150–450)
RBC: 4.26 x10E6/uL (ref 4.14–5.80)
RDW: 12.9 % (ref 11.6–15.4)
WBC: 4.3 x10E3/uL (ref 3.4–10.8)

## 2024-03-13 LAB — PSA: Prostate Specific Ag, Serum: 1.2 ng/mL (ref 0.0–4.0)

## 2024-03-18 ENCOUNTER — Other Ambulatory Visit: Payer: Self-pay

## 2024-04-02 ENCOUNTER — Other Ambulatory Visit (HOSPITAL_COMMUNITY): Payer: Self-pay

## 2024-04-12 ENCOUNTER — Other Ambulatory Visit (HOSPITAL_COMMUNITY): Payer: Self-pay

## 2024-04-16 ENCOUNTER — Other Ambulatory Visit (HOSPITAL_COMMUNITY): Payer: Self-pay

## 2024-05-27 ENCOUNTER — Telehealth: Payer: Self-pay | Admitting: Gastroenterology

## 2024-05-27 NOTE — Telephone Encounter (Signed)
 Called to schedule patient's colonoscopy and he is requesting for a nurse to call back.  He is wanting to know how does polyps develop.  Patient said he will call back to reschedule once his insurance comes in effect

## 2024-05-27 NOTE — Telephone Encounter (Signed)
Attempted to reach patient- LVM

## 2024-05-28 NOTE — Telephone Encounter (Signed)
 Attempted to reach patient. No answer, left VM for patient to return call.

## 2024-05-30 NOTE — Telephone Encounter (Signed)
 3rd attempt to reach pt. No answer, left vm for pt to return call.

## 2024-06-07 ENCOUNTER — Other Ambulatory Visit: Payer: Self-pay

## 2024-06-11 ENCOUNTER — Other Ambulatory Visit: Payer: Self-pay

## 2024-09-12 ENCOUNTER — Ambulatory Visit: Payer: Self-pay | Admitting: Internal Medicine
